# Patient Record
Sex: Female | Born: 1960 | Race: White | Hispanic: No | Marital: Married | Smoking: Current every day smoker
Health system: Southern US, Community
[De-identification: ages and names within clinical notes are randomized; demographics above are authoritative.]

## PROBLEM LIST (undated history)

## (undated) DIAGNOSIS — F419 Anxiety disorder, unspecified: Secondary | ICD-10-CM

## (undated) DIAGNOSIS — K219 Gastro-esophageal reflux disease without esophagitis: Secondary | ICD-10-CM

## (undated) DIAGNOSIS — E119 Type 2 diabetes mellitus without complications: Secondary | ICD-10-CM

## (undated) DIAGNOSIS — F319 Bipolar disorder, unspecified: Secondary | ICD-10-CM

## (undated) DIAGNOSIS — T8859XA Other complications of anesthesia, initial encounter: Secondary | ICD-10-CM

## (undated) DIAGNOSIS — F429 Obsessive-compulsive disorder, unspecified: Secondary | ICD-10-CM

## (undated) DIAGNOSIS — E7800 Pure hypercholesterolemia, unspecified: Secondary | ICD-10-CM

## (undated) DIAGNOSIS — I639 Cerebral infarction, unspecified: Secondary | ICD-10-CM

## (undated) DIAGNOSIS — F410 Panic disorder [episodic paroxysmal anxiety] without agoraphobia: Secondary | ICD-10-CM

## (undated) DIAGNOSIS — T4145XA Adverse effect of unspecified anesthetic, initial encounter: Secondary | ICD-10-CM

## (undated) DIAGNOSIS — F32A Depression, unspecified: Secondary | ICD-10-CM

## (undated) DIAGNOSIS — C439 Malignant melanoma of skin, unspecified: Secondary | ICD-10-CM

## (undated) DIAGNOSIS — J449 Chronic obstructive pulmonary disease, unspecified: Secondary | ICD-10-CM

## (undated) DIAGNOSIS — F329 Major depressive disorder, single episode, unspecified: Secondary | ICD-10-CM

## (undated) DIAGNOSIS — K269 Duodenal ulcer, unspecified as acute or chronic, without hemorrhage or perforation: Secondary | ICD-10-CM

## (undated) DIAGNOSIS — Z8489 Family history of other specified conditions: Secondary | ICD-10-CM

## (undated) DIAGNOSIS — I739 Peripheral vascular disease, unspecified: Secondary | ICD-10-CM

## (undated) DIAGNOSIS — I6529 Occlusion and stenosis of unspecified carotid artery: Secondary | ICD-10-CM

## (undated) HISTORY — DX: Bipolar disorder, unspecified: F31.9

## (undated) HISTORY — PX: TONSILLECTOMY: SUR1361

## (undated) HISTORY — DX: Peripheral vascular disease, unspecified: I73.9

## (undated) HISTORY — DX: Depression, unspecified: F32.A

## (undated) HISTORY — DX: Cerebral infarction, unspecified: I63.9

## (undated) HISTORY — DX: Anxiety disorder, unspecified: F41.9

## (undated) HISTORY — DX: Panic disorder (episodic paroxysmal anxiety): F41.0

## (undated) HISTORY — DX: Type 2 diabetes mellitus without complications: E11.9

## (undated) HISTORY — DX: Occlusion and stenosis of unspecified carotid artery: I65.29

## (undated) HISTORY — DX: Obsessive-compulsive disorder, unspecified: F42.9

## (undated) HISTORY — DX: Major depressive disorder, single episode, unspecified: F32.9

## (undated) HISTORY — DX: Malignant melanoma of skin, unspecified: C43.9

## (undated) HISTORY — DX: Gastro-esophageal reflux disease without esophagitis: K21.9

## (undated) HISTORY — DX: Duodenal ulcer, unspecified as acute or chronic, without hemorrhage or perforation: K26.9

## (undated) HISTORY — DX: Chronic obstructive pulmonary disease, unspecified: J44.9

---

## 1982-08-20 HISTORY — PX: TUBAL LIGATION: SHX77

## 1996-08-20 DIAGNOSIS — C439 Malignant melanoma of skin, unspecified: Secondary | ICD-10-CM

## 1996-08-20 HISTORY — DX: Malignant melanoma of skin, unspecified: C43.9

## 1996-08-20 HISTORY — PX: LYMPH NODE DISSECTION: SHX5087

## 1996-08-20 HISTORY — PX: MELANOMA EXCISION: SHX5266

## 1999-08-21 HISTORY — PX: CHOLECYSTECTOMY: SHX55

## 2010-08-20 HISTORY — PX: CATARACT EXTRACTION, BILATERAL: SHX1313

## 2011-10-29 ENCOUNTER — Encounter: Payer: Self-pay | Admitting: Family Medicine

## 2011-10-29 ENCOUNTER — Ambulatory Visit (INDEPENDENT_AMBULATORY_CARE_PROVIDER_SITE_OTHER): Payer: Medicare Other | Admitting: Family Medicine

## 2011-10-29 VITALS — BP 128/72 | HR 67 | Resp 18 | Ht 65.5 in | Wt 178.0 lb

## 2011-10-29 DIAGNOSIS — Z720 Tobacco use: Secondary | ICD-10-CM

## 2011-10-29 DIAGNOSIS — F17200 Nicotine dependence, unspecified, uncomplicated: Secondary | ICD-10-CM

## 2011-10-29 DIAGNOSIS — F410 Panic disorder [episodic paroxysmal anxiety] without agoraphobia: Secondary | ICD-10-CM

## 2011-10-29 DIAGNOSIS — J449 Chronic obstructive pulmonary disease, unspecified: Secondary | ICD-10-CM

## 2011-10-29 DIAGNOSIS — F319 Bipolar disorder, unspecified: Secondary | ICD-10-CM

## 2011-10-29 DIAGNOSIS — Z85820 Personal history of malignant melanoma of skin: Secondary | ICD-10-CM

## 2011-10-29 NOTE — Patient Instructions (Signed)
It was nice to meet you today! Schedule a physical within the next 6 weeks Do not eat after midnight as blood work will need to be done Please schedule Mammogram Continue current medications

## 2011-10-29 NOTE — Progress Notes (Signed)
  Subjective:    Patient ID: Ashley Powell, female    DOB: March 04, 1961, 51 y.o.   MRN: [id number]  HPI Pt here to establish care, Previous PCP Dr. Marchia Bond Psychiatrist- Dr. Carroll Sage, couselor Zada Girt  Bipolar- followed by psychiatry. She had been mental breakdown a few years ago requiring hospitalization. She was previously working for the police department. She has a very good support system with her husband and daughter. She was most recently started on intake. She is now on disability. She suffers with anxiety which is mostly controlled with Ativan. She has many social anxiety and is fearful of new things.  Panic attacks- patient deals with panic attacks daily. She has palpitations headache chest pains when they occur.  COPD-uses symbicort as needed. Continues to smoke one pack per day.   Review of Systems  GEN- denies fatigue, fever, weight loss,weakness, recent illness HEENT- denies eye drainage, change in vision, nasal discharge, CVS- denies chest pain,+ palpitations RESP- denies SOB, cough, wheeze ABD- denies N/V, change in stools, abd pain GU- denies dysuria, hematuria, dribbling, incontinence MSK- denies joint pain, muscle aches, injury Neuro- + headache, dizziness, syncope, seizure activity       Objective:   Physical Exam GEN- NAD, alert and oriented x3 HEENT- PERRL, EOMI, non injected sclera, pink conjunctiva, MMM, oropharynx clear Neck- Supple, no thyromegaly, no bruit CVS- RRR, no murmur RESP-CTAB ABD-NABS,soft, NT,ND EXT- No edema Pulses- Radial, DP- 2+ Psych- +anxious appearing,not depressed appearing, no apparent hallucinations       Assessment & Plan:

## 2011-10-31 ENCOUNTER — Ambulatory Visit: Payer: Self-pay | Admitting: Family Medicine

## 2011-11-01 ENCOUNTER — Encounter: Payer: Self-pay | Admitting: Family Medicine

## 2011-11-01 DIAGNOSIS — Z85820 Personal history of malignant melanoma of skin: Secondary | ICD-10-CM | POA: Insufficient documentation

## 2011-11-01 DIAGNOSIS — F410 Panic disorder [episodic paroxysmal anxiety] without agoraphobia: Secondary | ICD-10-CM | POA: Insufficient documentation

## 2011-11-01 DIAGNOSIS — F319 Bipolar disorder, unspecified: Secondary | ICD-10-CM | POA: Insufficient documentation

## 2011-11-01 DIAGNOSIS — J449 Chronic obstructive pulmonary disease, unspecified: Secondary | ICD-10-CM | POA: Insufficient documentation

## 2011-11-01 DIAGNOSIS — Z720 Tobacco use: Secondary | ICD-10-CM | POA: Insufficient documentation

## 2011-11-01 NOTE — Assessment & Plan Note (Signed)
She will need dermatology follow-up yearly for skin checks

## 2011-11-01 NOTE — Assessment & Plan Note (Signed)
Followed by psychiatry 

## 2011-11-01 NOTE — Assessment & Plan Note (Signed)
I will obtain records regarding this. She will need PFT done at some point, currently no everyday medications   Send for PCP Records next visit

## 2011-11-01 NOTE — Assessment & Plan Note (Signed)
Followed by pyschiatry 

## 2011-12-10 ENCOUNTER — Other Ambulatory Visit: Payer: Self-pay | Admitting: Family Medicine

## 2011-12-10 ENCOUNTER — Ambulatory Visit (INDEPENDENT_AMBULATORY_CARE_PROVIDER_SITE_OTHER): Payer: BC Managed Care – PPO | Admitting: Family Medicine

## 2011-12-10 ENCOUNTER — Encounter: Payer: Self-pay | Admitting: Family Medicine

## 2011-12-10 ENCOUNTER — Other Ambulatory Visit (HOSPITAL_COMMUNITY)
Admission: RE | Admit: 2011-12-10 | Discharge: 2011-12-10 | Disposition: A | Discharge status: Home / Self Care | Payer: BC Managed Care – PPO | Source: Ambulatory Visit | Attending: Family Medicine | Admitting: Family Medicine

## 2011-12-10 VITALS — BP 124/68 | HR 80 | Resp 18 | Ht 65.5 in | Wt 172.1 lb

## 2011-12-10 DIAGNOSIS — Z13228 Encounter for screening for other metabolic disorders: Secondary | ICD-10-CM

## 2011-12-10 DIAGNOSIS — Z79899 Other long term (current) drug therapy: Secondary | ICD-10-CM

## 2011-12-10 DIAGNOSIS — F17200 Nicotine dependence, unspecified, uncomplicated: Secondary | ICD-10-CM

## 2011-12-10 DIAGNOSIS — Z01419 Encounter for gynecological examination (general) (routine) without abnormal findings: Secondary | ICD-10-CM | POA: Insufficient documentation

## 2011-12-10 DIAGNOSIS — Z85828 Personal history of other malignant neoplasm of skin: Secondary | ICD-10-CM

## 2011-12-10 DIAGNOSIS — Z139 Encounter for screening, unspecified: Secondary | ICD-10-CM

## 2011-12-10 DIAGNOSIS — R197 Diarrhea, unspecified: Secondary | ICD-10-CM

## 2011-12-10 DIAGNOSIS — Z130 Encounter for screening for diseases of the blood and blood-forming organs and certain disorders involving the immune mechanism: Secondary | ICD-10-CM

## 2011-12-10 DIAGNOSIS — Z124 Encounter for screening for malignant neoplasm of cervix: Secondary | ICD-10-CM

## 2011-12-10 DIAGNOSIS — Z720 Tobacco use: Secondary | ICD-10-CM

## 2011-12-10 DIAGNOSIS — Z1321 Encounter for screening for nutritional disorder: Secondary | ICD-10-CM

## 2011-12-10 DIAGNOSIS — Z13220 Encounter for screening for lipoid disorders: Secondary | ICD-10-CM

## 2011-12-10 LAB — CBC WITH DIFFERENTIAL/PLATELET
Basophils Absolute: 0.1 10*3/uL (ref 0.0–0.1)
Basophils Relative: 1 % (ref 0–1)
Eosinophils Absolute: 0.3 10*3/uL (ref 0.0–0.7)
Hemoglobin: 14.5 g/dL (ref 12.0–15.0)
MCH: 30.1 pg (ref 26.0–34.0)
MCHC: 33.1 g/dL (ref 30.0–36.0)
Monocytes Absolute: 0.9 10*3/uL (ref 0.1–1.0)
Monocytes Relative: 13 % — ABNORMAL HIGH (ref 3–12)
Neutro Abs: 2.9 10*3/uL (ref 1.7–7.7)
Neutrophils Relative %: 41 % — ABNORMAL LOW (ref 43–77)
RDW: 14.4 % (ref 11.5–15.5)

## 2011-12-10 LAB — LIPID PANEL
Cholesterol: 326 mg/dL — ABNORMAL HIGH (ref 0–200)
LDL Cholesterol: 203 mg/dL — ABNORMAL HIGH (ref 0–99)
Triglycerides: 373 mg/dL — ABNORMAL HIGH (ref <150)
VLDL: 75 mg/dL — ABNORMAL HIGH (ref 0–40)

## 2011-12-10 LAB — TSH: TSH: 2.783 u[IU]/mL (ref 0.350–4.500)

## 2011-12-10 LAB — COMPREHENSIVE METABOLIC PANEL
ALT: 15 U/L (ref 0–35)
AST: 24 U/L (ref 0–37)
Albumin: 4.4 g/dL (ref 3.5–5.2)
Alkaline Phosphatase: 52 U/L (ref 39–117)
Calcium: 10.2 mg/dL (ref 8.4–10.5)
Chloride: 100 mEq/L (ref 96–112)
Potassium: 5.5 mEq/L — ABNORMAL HIGH (ref 3.5–5.3)
Sodium: 138 mEq/L (ref 135–145)

## 2011-12-10 LAB — POC HEMOCCULT BLD/STL (OFFICE/1-CARD/DIAGNOSTIC): Fecal Occult Blood, POC: NEGATIVE

## 2011-12-10 NOTE — Assessment & Plan Note (Signed)
PAP Smear done Pt to check with insurance about shingles vaccine Labs to be done Mammogram to be scheduled

## 2011-12-10 NOTE — Patient Instructions (Signed)
We will send a letter with your lab results, labs will also be sent to Dr. Tiburcio Pea Continue current meds I recommend calcium(1200mg ) and Vit D (800IU) supplement daily  Mammogram to be scheduled I will make a referral for GI and Dermatology I recommend Eye visit once a year I recommend Dental visit every 6 months Continue to work on the smoking F/U 6 months

## 2011-12-10 NOTE — Assessment & Plan Note (Signed)
Refer for evaluation and colonoscopy

## 2011-12-10 NOTE — Progress Notes (Signed)
  Subjective:    Patient ID: Ashley Powell, female    DOB: [birth year], 51 y.o.   MRN: [id number]  HPI Patient here for GYN exam. Medications and history reviewed Last menstrual cycle 2008 Diarrhea- chronic history of diarrhea after eating. She has multiple evacuations of runny watery stool a day. She has not seen gastroenterology for this. Denies any blood in the stool.She feels this is worsening  Due for mammogram Due for labs  Review of Systems  GEN- denies fatigue, fever, weight loss,weakness, recent illness HEENT- denies eye drainage, change in vision, nasal discharge, CVS- denies chest pain, palpitations RESP- denies SOB, cough, wheeze ABD- denies N/V, +change in stools, abd pain GU- denies dysuria, hematuria, dribbling, incontinence MSK- denies joint pain, muscle aches, injury Neuro- denies headache, dizziness, syncope, seizure activity      Objective:   Physical Exam GEN- NAD, alert and oriented x 3 Breast- normal symmetry, no nipple inversion,no nipple drainage, no nodules or lumps felt Nodes- no axillary nodes ABD-NABS,soft, NT,ND GU- normal external genitalia, vaginal mucosa pink and moist, cervix visualized no growth, no blood form os,no discharge, no CMT, no ovarian masses, uterus normal size        Assessment & Plan:

## 2011-12-10 NOTE — Assessment & Plan Note (Signed)
Advised cessation 

## 2011-12-10 NOTE — Assessment & Plan Note (Signed)
Refer to establish for skin surveillance

## 2011-12-11 ENCOUNTER — Encounter: Payer: Self-pay | Admitting: Urgent Care

## 2011-12-11 ENCOUNTER — Ambulatory Visit (HOSPITAL_COMMUNITY)
Admission: RE | Admit: 2011-12-11 | Discharge: 2011-12-11 | Disposition: A | Discharge status: Home / Self Care | Payer: BC Managed Care – PPO | Source: Ambulatory Visit | Attending: Family Medicine | Admitting: Family Medicine

## 2011-12-11 ENCOUNTER — Ambulatory Visit (INDEPENDENT_AMBULATORY_CARE_PROVIDER_SITE_OTHER): Payer: BC Managed Care – PPO | Admitting: Urgent Care

## 2011-12-11 VITALS — BP 119/71 | HR 85 | Temp 97.7°F | Ht 65.0 in | Wt 173.0 lb

## 2011-12-11 DIAGNOSIS — R110 Nausea: Secondary | ICD-10-CM | POA: Insufficient documentation

## 2011-12-11 DIAGNOSIS — Z1231 Encounter for screening mammogram for malignant neoplasm of breast: Secondary | ICD-10-CM | POA: Insufficient documentation

## 2011-12-11 DIAGNOSIS — R197 Diarrhea, unspecified: Secondary | ICD-10-CM

## 2011-12-11 DIAGNOSIS — K219 Gastro-esophageal reflux disease without esophagitis: Secondary | ICD-10-CM

## 2011-12-11 DIAGNOSIS — Z139 Encounter for screening, unspecified: Secondary | ICD-10-CM

## 2011-12-11 MED ORDER — SOD PICOSULFATE-MAG OX-CIT ACD 10-3.5-12 MG-GM-GM PO PACK: 1.0000 | PACK | Freq: Once | ORAL | Status: DC

## 2011-12-11 NOTE — Progress Notes (Signed)
Referring Provider: Salley Scarlet, MD Primary Care Physician:  Milinda Antis, MD, MD Primary Gastroenterologist:  Dr. Darrick Penna  Chief Complaint  Patient presents with  . Diarrhea   HPI:  Ashley Powell is a 51 y.o. female here as a referral from Dr. Jeanice Lim for diarrhea.  Also c/o nausea w/ eating a few bites of food.  Denies vomiting.  Notes wt loss 4 sizes & 30# in past year.  As soon as she eats, runs to bathroom w/ significant urgency to defecate.  Has been having episodes of explosive watery diarrhea followed by multiple stools.  She has no warning her to the onset of her symptoms.  Her symptoms occur frequently when she goes out to eat. Rare solid stools.  Denies any history of constipation. Denies rectal bleeding, mucus in stool, or melena.  Notice is that her stools are greenish-yellow.  Diarrhea has been persistent for years however it has been worse status post cholecystectomy years ago.  She has daily symptoms.  She also has been having daily heartburn and indigestion. She is not on PPI, nor taking any over-the-counter meds for this. Denies dysphagia or odynophagia.  Past Medical History  Diagnosis Date  . Depression   . COPD (chronic obstructive pulmonary disease) 2010 ?  Marland Kitchen GERD (gastroesophageal reflux disease)   . Malignant melanoma 1998    Stage III-left shoulder  . Bipolar 1 disorder   . Anxiety   . Panic attacks   . DU (duodenal ulcer)     age 54    Past Surgical History  Procedure Date  . Tubal ligation 1984  . Cholecystectomy 2001  . Cataract extraction, bilateral 2012  . Melanoma excision 1998  . Lymph node dissection 1998    Current Outpatient Prescriptions  Medication Sig Dispense Refill  . divalproex (DEPAKOTE ER) 500 MG 24 hr tablet Take by mouth daily. 500mg  in AM, 1000mg  in pm      . hydrOXYzine (ATARAX/VISTARIL) 50 MG tablet Take 50 mg by mouth at bedtime.      Marland Kitchen LORazepam (ATIVAN) 1 MG tablet Take 1 mg by mouth every 6 (six) hours as needed.      .  methylphenidate (RITALIN) 5 MG tablet Take 5 mg by mouth 2 (two) times daily. Specific dose unknown      . sertraline (ZOLOFT) 100 MG tablet Take 200 mg by mouth daily.      . traZODone (DESYREL) 100 MG tablet Take 50 mg by mouth at bedtime.         Allergies as of 12/11/2011  . (No Known Allergies)    Family History:There is no known family history of colorectal carcinoma , liver disease, or inflammatory bowel disease.  Problem Relation Age of Onset  . Depression Mother   . Hypertension Father   . Hyperlipidemia Father   . Heart disease Father   . Diabetes Father   . Hypertension Brother   . Heart disease Brother   . Hyperlipidemia Brother   . Depression Brother     History   Social History  . Marital Status: Married    Spouse Name: N/A    Number of Children: 1  . Years of Education: N/A   Occupational History  . disabled-police dept    Social History Main Topics  . Smoking status: Current Everyday Smoker -- 1.0 packs/day for 22 years    Types: Cigarettes  . Smokeless tobacco: Not on file  . Alcohol Use: No  . Drug Use: No  .  Sexually Active: Not on file  Review of Systems: Gen: Denies any fever, chills, sweats, anorexia, fatigue, weakness, malaise, weight loss, and sleep disorder CV: Denies chest pain, angina, palpitations, syncope, orthopnea, PND, peripheral edema, and claudication. Resp: Denies dyspnea at rest, dyspnea with exercise, cough, sputum, wheezing, coughing up blood, and pleurisy. GI: Denies vomiting blood, jaundice, and fecal incontinence.  GU : Denies urinary burning, blood in urine, urinary frequency, urinary hesitancy, nocturnal urination, and urinary incontinence. MS: Denies joint pain, limitation of movement, and swelling, stiffness, low back pain, extremity pain. Denies muscle weakness, cramps, atrophy.  Derm: Denies rash, itching, dry skin, hives, moles, warts, or unhealing ulcers.  Psych: Denies depression, anxiety, memory loss, suicidal  ideation, hallucinations, paranoia, and confusion. Heme: Denies bruising, bleeding, and enlarged lymph nodes. Neuro:  Denies any headaches, dizziness, paresthesias. Endo:  Denies any problems with DM, thyroid, adrenal function.  Physical Exam: BP 119/71  Pulse 85  Temp(Src) 97.7 F (36.5 C) (Temporal)  Ht 5\' 5"  (1.651 m)  Wt 173 lb (78.472 kg)  BMI 28.79 kg/m2 General:   Alert,  Well-developed, well-nourished, pleasant and cooperative in NAD Head:  Normocephalic and atraumatic. Eyes:  Sclera clear, no icterus.   Conjunctiva pink. Ears:  Normal auditory acuity. Nose:  No deformity, discharge, or lesions. Mouth:  No deformity or lesions,oropharynx pink & moist. Neck:  Supple; no masses or thyromegaly. Lungs:  Clear throughout to auscultation.   No wheezes, crackles, or rhonchi. No acute distress. Heart:  Regular rate and rhythm; no murmurs, clicks, rubs,  or gallops. Abdomen:  Normal bowel sounds.  No bruits.  Soft, non-tender and non-distended without masses, hepatosplenomegaly or hernias noted.  No guarding or rebound tenderness.   Rectal:  Deferred until colonoscopy. Msk:  Symmetrical without gross deformities. Normal posture. Pulses:  Normal pulses noted. Extremities:  No clubbing or edema. Neurologic:  Alert and oriented x4;  grossly normal neurologically. Skin:  Intact without significant lesions or rashes. Lymph Nodes:  No significant cervical adenopathy. Psych:  Alert and cooperative. Normal mood and affect.

## 2011-12-11 NOTE — Patient Instructions (Signed)
Begin ALIGN daily Return stool studies and get labs as soon as possible You will need a colonoscopy and EGD (upper endoscopy) with Dr. Darrick Penna 1-800-quit-now for help quitting smoking

## 2011-12-12 ENCOUNTER — Telehealth: Payer: Self-pay | Admitting: Family Medicine

## 2011-12-12 DIAGNOSIS — E875 Hyperkalemia: Secondary | ICD-10-CM

## 2011-12-12 DIAGNOSIS — K219 Gastro-esophageal reflux disease without esophagitis: Secondary | ICD-10-CM | POA: Insufficient documentation

## 2011-12-12 MED ORDER — ROSUVASTATIN CALCIUM 10 MG PO TABS: 10.0000 mg | ORAL_TABLET | Freq: Every day | ORAL | Status: DC

## 2011-12-12 NOTE — Assessment & Plan Note (Addendum)
Ashley Powell is a pleasant 51 y.o. female  w/ [place] any history of chronic nonbloody diarrhea, worse than status post cholecystectomy 2001, and more frequently worse over the last several weeks associated with nausea and significant urgency.  She will need colonoscopy by Dr. Darrick Penna for further evaluation.  I have discussed risks & benefits which include, but are not limited to, bleeding, infection, perforation & drug reaction.  The patient agrees with this plan & written consent will be obtained.  Differentials include irritable bowel syndrome, microscopic colitis, or celiac disease. Doubt acute infectious process on chronic etiology, however will check Giardia, culture, lactoferrin  and C. Difficile.  TTG IgA Begin ALIGN daily 1-800-quit-now for help quitting smoking

## 2011-12-12 NOTE — Progress Notes (Signed)
Faxed to PCP

## 2011-12-12 NOTE — Assessment & Plan Note (Signed)
Daily GERD symptoms with nausea.  Hx duodenal ulcer as child.  History of malignant melanoma in remission. Offered PPI & antiemetics.  Pt declined.  EGD for further evaluation to look for peptic ulcer disease, gastritis, complicated GERD, less likely malignancy.  I have discussed risks & benefits which include, but are not limited to, bleeding, infection, perforation & drug reaction.  The patient agrees with this plan & written consent will be obtained.

## 2011-12-12 NOTE — Telephone Encounter (Signed)
Please give pt message, her cholesterol is extremely high, she needs to start a cholesterol lowering medication, Please have her start the crestor at bedtime. She needs to avoid fried food, fatty foods, eat more fruits and veggies, look on containers for low fat , low cholesterol foods If she is taking a potassium supplement she needs to stop as her potassium levels are a little high, Her kidney and liver function are normal, hemoglobin is normal Thyroid is normal Vitamin D is pending. If this is low we will send in replacement  for her.  Please fax a copy of labs to her psychiatrist Carroll Sage and send pt a copy of labs

## 2011-12-12 NOTE — Assessment & Plan Note (Signed)
See GERD 

## 2011-12-14 LAB — GIARDIA/CRYPTOSPORIDIUM (EIA): Cryptosporidium Screen (EIA): NEGATIVE

## 2011-12-14 LAB — TISSUE TRANSGLUTAMINASE, IGA: Tissue Transglutaminase Ab, IgA: 6.2 U/mL (ref <20)

## 2011-12-17 LAB — STOOL CULTURE

## 2011-12-17 NOTE — Progress Notes (Signed)
Quick Note:  Please let pt know her stool studies were negative for infection or inflammation. Her blood work was negative for celiac disease. Proceed w/ colonoscopy & EGD as planned. Thanks RU:[id number], Kingsley Spittle, MD ______

## 2011-12-17 NOTE — Progress Notes (Signed)
Quick Note:  Called and informed pt. ______ 

## 2011-12-21 LAB — BASIC METABOLIC PANEL
CO2: 28 mEq/L (ref 19–32)
Glucose, Bld: 82 mg/dL (ref 70–99)
Potassium: 5 mEq/L (ref 3.5–5.3)
Sodium: 137 mEq/L (ref 135–145)

## 2011-12-24 ENCOUNTER — Encounter (HOSPITAL_COMMUNITY): Payer: Self-pay | Admitting: Pharmacy Technician

## 2011-12-25 ENCOUNTER — Encounter (HOSPITAL_COMMUNITY): Payer: Self-pay

## 2011-12-25 ENCOUNTER — Encounter (HOSPITAL_COMMUNITY)
Admission: RE | Admit: 2011-12-25 | Discharge: 2011-12-25 | Disposition: A | Discharge status: Home / Self Care | Payer: BC Managed Care – PPO | Source: Ambulatory Visit | Attending: Gastroenterology | Admitting: Gastroenterology

## 2011-12-25 HISTORY — DX: Pure hypercholesterolemia, unspecified: E78.00

## 2011-12-25 NOTE — Patient Instructions (Signed)
21 Ashley Powell  12/25/2011   Your procedure is scheduled on:  Tuesday, 01/01/12  Report to [facility] at 0730 AM.  Call this number if you have problems the morning of surgery: [phone]   Remember:   Do not eat food:After Midnight.  May have clear liquids:until Midnight .  Clear liquids include soda, tea, black coffee, apple or grape juice, broth.  Take these medicines the morning of surgery with A SIP OF WATER: depakote, ritalin, zoloft, ativan if needed   Do not wear jewelry, make-up or nail polish.  Do not wear lotions, powders, or perfumes. You may wear deodorant.  Do not shave 48 hours prior to surgery.  Do not bring valuables to the hospital.  Contacts, dentures or bridgework may not be worn into surgery.  Leave suitcase in the car. After surgery it may be brought to your room.  For patients admitted to the hospital, checkout time is 11:00 AM the day of discharge.   Patients discharged the day of surgery will not be allowed to drive home.  Name and phone number of your driver: driver  Special Instructions: Follow special instructions given to you at Dr. Odis Luster office about your prep for procedure.   Please read over the following fact sheets that you were given: Anesthesia Post-op Instructions and Care and Recovery After Surgery   Esophagogastroduodenoscopy This is an endoscopic procedure (a procedure that uses a device like a flexible telescope) that allows your caregiver to view the upper stomach and small bowel. This test allows your caregiver to look at the esophagus. The esophagus carries food from your mouth to your stomach. They can also look at your duodenum. This is the first part of the small intestine that attaches to the stomach. This test is used to detect problems in the bowel such as ulcers and inflammation. PREPARATION FOR TEST Nothing to eat after midnight the day before the test. NORMAL FINDINGS Normal esophagus, stomach, and duodenum. Ranges for normal  findings may vary among different laboratories and hospitals. You should always check with your doctor after having lab work or other tests done to discuss the meaning of your test results and whether your values are considered within normal limits. MEANING OF TEST  Your caregiver will go over the test results with you and discuss the importance and meaning of your results, as well as treatment options and the need for additional tests if necessary. OBTAINING THE TEST RESULTS It is your responsibility to obtain your test results. Ask the lab or department performing the test when and how you will get your results. Document Released: 12/07/2004 Document Revised: 07/26/2011 Document Reviewed: 07/16/2008 [facility] Patient Information 2012 [place], [place].Colonoscopy Care After These instructions give you information on caring for yourself after your procedure. Your doctor may also give you more specific instructions. Call your doctor if you have any problems or questions after your procedure. HOME CARE  Take it easy for the next 24 hours.   Rest.   Walk or use warm packs on your belly (abdomen) if you have belly cramping or gas.   Do not drive for 24 hours.   You may shower.   Do not sign important papers or use machinery for 24 hours.   Drink enough fluids to keep your pee (urine) clear or pale yellow.   Resume your normal diet. Avoid heavy or fried foods.   Avoid alcohol.   Continue taking your normal medicines.   Only take medicine as told by your doctor.  Do not take aspirin.  If you had growths (polyps) removed:  Do not take aspirin.   Do not drink alcohol for 7 days or as told by your doctor.   Eat a soft diet for 24 hours.  GET HELP RIGHT AWAY IF:  You have a fever.   You pass clumps of tissue (blood clots) or fill the toilet with blood.   You have belly pain that gets worse and medicine does not help.   Your belly is puffy (swollen).   You feel sick to your stomach  (nauseous) or throw up (vomit).  MAKE SURE YOU:  Understand these instructions.   Will watch your condition.   Will get help right away if you are not doing well or get worse.  Document Released: 09/08/2010 Document Revised: 07/26/2011 Document Reviewed: 09/08/2010 [facility] Patient Information 2012 [place], [place].

## 2011-12-27 NOTE — Progress Notes (Signed)
REVIEWED.  

## 2011-12-27 NOTE — Progress Notes (Signed)
TCS MAY 14

## 2011-12-31 ENCOUNTER — Telehealth: Payer: Self-pay | Admitting: Gastroenterology

## 2011-12-31 NOTE — Telephone Encounter (Signed)
Pt called- she has lost her instructions & wanted them emailed to her- instructions emailed to [email or web address]

## 2012-01-01 ENCOUNTER — Ambulatory Visit (HOSPITAL_COMMUNITY): Payer: BC Managed Care – PPO | Admitting: Anesthesiology

## 2012-01-01 ENCOUNTER — Encounter (HOSPITAL_COMMUNITY): Payer: Self-pay | Admitting: Anesthesiology

## 2012-01-01 ENCOUNTER — Encounter (HOSPITAL_COMMUNITY): Payer: Self-pay | Admitting: *Deleted

## 2012-01-01 ENCOUNTER — Ambulatory Visit (HOSPITAL_COMMUNITY)
Admission: RE | Admit: 2012-01-01 | Discharge: 2012-01-01 | Disposition: A | Discharge status: Home / Self Care | Payer: BC Managed Care – PPO | Source: Ambulatory Visit | Attending: Gastroenterology | Admitting: Gastroenterology

## 2012-01-01 ENCOUNTER — Encounter (HOSPITAL_COMMUNITY)
Admission: RE | Disposition: A | Discharge status: Home / Self Care | Payer: Self-pay | Source: Ambulatory Visit | Attending: Gastroenterology

## 2012-01-01 DIAGNOSIS — K2990 Gastroduodenitis, unspecified, without bleeding: Secondary | ICD-10-CM

## 2012-01-01 DIAGNOSIS — K620 Anal polyp: Secondary | ICD-10-CM

## 2012-01-01 DIAGNOSIS — R109 Unspecified abdominal pain: Secondary | ICD-10-CM

## 2012-01-01 DIAGNOSIS — K621 Rectal polyp: Secondary | ICD-10-CM

## 2012-01-01 DIAGNOSIS — E7800 Pure hypercholesterolemia, unspecified: Secondary | ICD-10-CM | POA: Insufficient documentation

## 2012-01-01 DIAGNOSIS — K449 Diaphragmatic hernia without obstruction or gangrene: Secondary | ICD-10-CM | POA: Insufficient documentation

## 2012-01-01 DIAGNOSIS — K2970 Gastritis, unspecified, without bleeding: Secondary | ICD-10-CM

## 2012-01-01 DIAGNOSIS — J449 Chronic obstructive pulmonary disease, unspecified: Secondary | ICD-10-CM | POA: Insufficient documentation

## 2012-01-01 DIAGNOSIS — J4489 Other specified chronic obstructive pulmonary disease: Secondary | ICD-10-CM | POA: Insufficient documentation

## 2012-01-01 DIAGNOSIS — D128 Benign neoplasm of rectum: Secondary | ICD-10-CM | POA: Insufficient documentation

## 2012-01-01 DIAGNOSIS — R197 Diarrhea, unspecified: Secondary | ICD-10-CM | POA: Insufficient documentation

## 2012-01-01 DIAGNOSIS — D126 Benign neoplasm of colon, unspecified: Secondary | ICD-10-CM

## 2012-01-01 DIAGNOSIS — K648 Other hemorrhoids: Secondary | ICD-10-CM | POA: Insufficient documentation

## 2012-01-01 DIAGNOSIS — K222 Esophageal obstruction: Secondary | ICD-10-CM | POA: Insufficient documentation

## 2012-01-01 HISTORY — PX: COLONOSCOPY: SHX174

## 2012-01-01 HISTORY — PX: ESOPHAGOGASTRODUODENOSCOPY: SHX1529

## 2012-01-01 SURGERY — COLONOSCOPY WITH PROPOFOL
Anesthesia: Monitor Anesthesia Care

## 2012-01-01 MED ORDER — OMEPRAZOLE 20 MG PO CPDR: DELAYED_RELEASE_CAPSULE | ORAL | Status: DC

## 2012-01-01 MED ORDER — GLYCOPYRROLATE 0.2 MG/ML IJ SOLN
INTRAMUSCULAR | Status: AC
  Filled 2012-01-01: qty 1

## 2012-01-01 MED ORDER — STERILE WATER FOR IRRIGATION IR SOLN
Status: DC | PRN
  Administered 2012-01-01: 09:00:00

## 2012-01-01 MED ORDER — LIDOCAINE HCL 1 % IJ SOLN
INTRAMUSCULAR | Status: DC | PRN
  Administered 2012-01-01: 20 mg via INTRADERMAL

## 2012-01-01 MED ORDER — GLYCOPYRROLATE 0.2 MG/ML IJ SOLN
0.2000 mg | Freq: Once | INTRAMUSCULAR | Status: AC
  Administered 2012-01-01: 0.2 mg via INTRAVENOUS

## 2012-01-01 MED ORDER — WATER FOR IRRIGATION, STERILE IR SOLN
Status: DC | PRN
  Administered 2012-01-01: 1000 mL

## 2012-01-01 MED ORDER — MIDAZOLAM HCL 2 MG/2ML IJ SOLN
INTRAMUSCULAR | Status: AC
  Filled 2012-01-01: qty 2

## 2012-01-01 MED ORDER — LACTATED RINGERS IV SOLN
INTRAVENOUS | Status: DC
  Administered 2012-01-01: 1000 mL via INTRAVENOUS

## 2012-01-01 MED ORDER — DICYCLOMINE HCL 10 MG PO CAPS: 10.0000 mg | ORAL_CAPSULE | Freq: Four times a day (QID) | ORAL | Status: DC

## 2012-01-01 MED ORDER — FENTANYL CITRATE 0.05 MG/ML IJ SOLN
INTRAMUSCULAR | Status: AC
  Filled 2012-01-01: qty 2

## 2012-01-01 MED ORDER — BUTAMBEN-TETRACAINE-BENZOCAINE 2-2-14 % EX AERO
1.0000 | INHALATION_SPRAY | Freq: Once | CUTANEOUS | Status: AC
  Administered 2012-01-01: 1 via TOPICAL
  Filled 2012-01-01: qty 56

## 2012-01-01 MED ORDER — FENTANYL CITRATE 0.05 MG/ML IJ SOLN: 25.0000 ug | INTRAMUSCULAR | Status: DC | PRN

## 2012-01-01 MED ORDER — MIDAZOLAM HCL 2 MG/2ML IJ SOLN
1.0000 mg | INTRAMUSCULAR | Status: DC | PRN
  Administered 2012-01-01: 2 mg via INTRAVENOUS

## 2012-01-01 MED ORDER — ONDANSETRON HCL 4 MG/2ML IJ SOLN
INTRAMUSCULAR | Status: AC
  Filled 2012-01-01: qty 2

## 2012-01-01 MED ORDER — FENTANYL CITRATE 0.05 MG/ML IJ SOLN
INTRAMUSCULAR | Status: DC | PRN
  Administered 2012-01-01 (×2): 50 ug via INTRAVENOUS

## 2012-01-01 MED ORDER — MIDAZOLAM HCL 5 MG/5ML IJ SOLN
INTRAMUSCULAR | Status: DC | PRN
  Administered 2012-01-01: 2 mg via INTRAVENOUS

## 2012-01-01 MED ORDER — ONDANSETRON HCL 4 MG/2ML IJ SOLN
4.0000 mg | Freq: Once | INTRAMUSCULAR | Status: AC | PRN
  Administered 2012-01-01: 4 mg via INTRAVENOUS

## 2012-01-01 MED ORDER — PROPOFOL 10 MG/ML IV BOLUS
INTRAVENOUS | Status: DC | PRN
  Administered 2012-01-01 (×2): 20 mg via INTRAVENOUS

## 2012-01-01 MED ORDER — PROPOFOL 10 MG/ML IV EMUL
INTRAVENOUS | Status: DC | PRN
  Administered 2012-01-01: 50 ug/kg/min via INTRAVENOUS

## 2012-01-01 MED ORDER — PROPOFOL 10 MG/ML IV EMUL
INTRAVENOUS | Status: AC
  Filled 2012-01-01: qty 20

## 2012-01-01 SURGICAL SUPPLY — 23 items
BLOCK BITE 60FR ADLT L/F BLUE (MISCELLANEOUS) ×2 IMPLANT
ELECT REM PT RETURN 9FT ADLT (ELECTROSURGICAL)
ELECTRODE REM PT RTRN 9FT ADLT (ELECTROSURGICAL) IMPLANT
FCP BXJMBJMB 240X2.8X (CUTTING FORCEPS)
FLOOR PAD 36X40 (MISCELLANEOUS) ×2
FORCEP RJ3 GP 1.8X160 W-NEEDLE (CUTTING FORCEPS) IMPLANT
FORCEPS BIOP RAD 4 LRG CAP 4 (CUTTING FORCEPS) ×4 IMPLANT
FORCEPS BIOP RJ4 240 W/NDL (CUTTING FORCEPS)
FORCEPS BXJMBJMB 240X2.8X (CUTTING FORCEPS) IMPLANT
INJECTOR/SNARE I SNARE (MISCELLANEOUS) IMPLANT
LUBRICANT JELLY 4.5OZ STERILE (MISCELLANEOUS) ×2 IMPLANT
MANIFOLD NEPTUNE II (INSTRUMENTS) ×2 IMPLANT
NEEDLE SCLEROTHERAPY 25GX240 (NEEDLE) IMPLANT
PAD FLOOR 36X40 (MISCELLANEOUS) ×1 IMPLANT
PROBE APC STR FIRE (PROBE) IMPLANT
PROBE INJECTION GOLD (MISCELLANEOUS)
PROBE INJECTION GOLD 7FR (MISCELLANEOUS) IMPLANT
SNARE SHORT THROW 13M SML OVAL (MISCELLANEOUS) ×2 IMPLANT
SYR 50ML LL SCALE MARK (SYRINGE) ×2 IMPLANT
TRAP SPECIMEN MUCOUS 40CC (MISCELLANEOUS) IMPLANT
TUBING ENDO SMARTCAP PENTAX (MISCELLANEOUS) ×4 IMPLANT
TUBING IRRIGATION ENDOGATOR (MISCELLANEOUS) ×2 IMPLANT
WATER STERILE IRR 1000ML POUR (IV SOLUTION) ×4 IMPLANT

## 2012-01-01 NOTE — Transfer of Care (Signed)
Immediate Anesthesia Transfer of Care Note  Patient: Ashley Powell  Procedure(s) Performed: Procedure(s) (LRB): COLONOSCOPY WITH PROPOFOL (N/A) ESOPHAGOGASTRODUODENOSCOPY (EGD) WITH PROPOFOL (N/A)  Patient Location: PACU  Anesthesia Type: MAC  Level of Consciousness: sedated  Airway & Oxygen Therapy: Patient Spontanous Breathing  Post-op Assessment: Post -op Vital signs reviewed and stable  Post vital signs: Reviewed and stable  Complications: No apparent anesthesia complications

## 2012-01-01 NOTE — Addendum Note (Signed)
Addendum  created 01/01/12 1017 by Moshe Salisbury, CRNA   Modules edited:Anesthesia Flowsheet

## 2012-01-01 NOTE — Addendum Note (Signed)
Addendum  created 01/01/12 1018 by Moshe Salisbury, CRNA   Modules edited:Anesthesia Flowsheet

## 2012-01-01 NOTE — H&P (Signed)
Primary Care Physician:  Milinda Antis, MD, MD Primary Gastroenterologist:  Dr. Darrick Penna  Pre-Procedure History & Physical: HPI:  Ashley Powell is a 51 y.o. female here for DIARRHEA/GERD.  Past Medical History  Diagnosis Date  . Depression   . COPD (chronic obstructive pulmonary disease) 2010 ?  Marland Kitchen GERD (gastroesophageal reflux disease)   . Malignant melanoma 1998    Stage III-left shoulder  . Bipolar 1 disorder   . Anxiety   . Panic attacks   . DU (duodenal ulcer)     age 63  . Hypercholesteremia     Past Surgical History  Procedure Date  . Tubal ligation 1984  . Cholecystectomy 2001  . Cataract extraction, bilateral 2012  . Melanoma excision 1998    [facility]  . Lymph node dissection 1998    Prior to Admission medications   Medication Sig Start Date End Date Taking? Authorizing Provider  divalproex (DEPAKOTE) 500 MG DR tablet Take 500-1,000 mg by mouth 2 (two) times daily. Take 1 tablet (500 mg) in the morning and 2 tablets (1000 mg) at bedtime   Yes Historical Provider, MD  hydrOXYzine (ATARAX/VISTARIL) 50 MG tablet Take 50 mg by mouth at bedtime.   Yes Historical Provider, MD  LORazepam (ATIVAN) 1 MG tablet Take 1 mg by mouth 4 (four) times daily as needed. For anxiety   Yes Historical Provider, MD  methylphenidate (RITALIN) 20 MG tablet Take 10 mg by mouth 3 (three) times daily.   Yes Historical Provider, MD  Probiotic Product (ALIGN) 4 MG CAPS Take 1 capsule by mouth every morning.   Yes Historical Provider, MD  rosuvastatin (CRESTOR) 10 MG tablet Take 1 tablet (10 mg total) by mouth at bedtime. 12/12/11 12/11/12 Yes Salley Scarlet, MD  sertraline (ZOLOFT) 100 MG tablet Take 200 mg by mouth every morning.    Yes Historical Provider, MD  Sod Picosulfate-Mag Ox-Cit Acd (PREPOPIK) 10-3.5-12 MG-GM-GM PACK Take 1 kit by mouth once. 12/11/11  Yes [place] Bali, MD  traZODone (DESYREL) 100 MG tablet Take 100 mg by mouth at bedtime.    Yes Historical Provider, MD     Allergies as of 12/11/2011  . (Not on File)    Family History  Problem Relation Age of Onset  . Depression Mother   . Hypertension Father   . Hyperlipidemia Father   . Heart disease Father   . Diabetes Father   . Hypertension Brother   . Heart disease Brother   . Hyperlipidemia Brother   . Depression Brother     History   Social History  . Marital Status: Married    Spouse Name: N/A    Number of Children: 1  . Years of Education: N/A   Occupational History  . disabled-police dept    Social History Main Topics  . Smoking status: Current Everyday Smoker -- 1.0 packs/day for 22 years    Types: Cigarettes  . Smokeless tobacco: Not on file  . Alcohol Use: No  . Drug Use: No  . Sexually Active: Not on file   Other Topics Concern  . Not on file   Social History Narrative  . No narrative on file    Review of Systems: See HPI, otherwise negative ROS   Physical Exam: BP 130/79  Pulse 78  Temp(Src) 97.8 F (36.6 C) (Oral)  Resp 11  SpO2 98% General:   Alert,  pleasant and cooperative in NAD Head:  Normocephalic and atraumatic. Neck:  Supple;  Lungs:  Clear throughout  to auscultation.    Heart:  Regular rate and rhythm. Abdomen:  Soft, nontender and nondistended. Normal bowel sounds, without guarding, and without rebound.   Neurologic:  Alert and  oriented x4;  grossly normal neurologically.  Impression/Plan:    DIARRHEA/GERD  PLAN: 1. TCS/EGD TODAY.

## 2012-01-01 NOTE — Discharge Instructions (Signed)
You have REFLUX, WHICH HAS CAUSED MILD NARROWING AT THE BASE OF YOUR ESOPHAGUS. YOU NEED TREATMENT FOR YOUR REFLUX OR THE NARROWING WILL GET WORSE.  YOUR HAVE A SMALL HIATAL HERNIA AND GASTRITIS. YOU HAD 8 SMALL POLYPS REMOVED. You have internal hemorrhoids, WHICH MAY CAUSE RECTAL BLEEDING. I biopsied your stomach, SMALL BOWEL, & colon.  START OMEPRAZOLE TWICE DAILY FOR 3 MOS THEN DAILY.  ADD BENTYL 30 MINUTES PRIOR TO MEALS AND AT BEDTIME.  STOP ASPIRIN PRODUCTS FOR 2 WEEKS.  AVOID ITEMS THAT TRIGGER GASTRITIS. SEE INFO BELOW.  FOLLOW A HIGH FIBER/LOW FAT DIET. AVOID ITEMS THAT CAUSE BLOATING. SEE INFO BELOW.  LOSE 10 TO 20 LBS. IT WILL HELP YOUR REFLUX BE BETTER CONTROLLED AND DECREASE YOUR RISK FOR COLON CANCER.  YOUR BIOPSY RESULTS WILL BE BACK IN 7 DAYS.  FOLLOW UP IN 4 MOS.   ENDOSCOPY Care After Read the instructions outlined below and refer to this sheet in the next week. These discharge instructions provide you with general information on caring for yourself after you leave the hospital. While your treatment has been planned according to the most current medical practices available, unavoidable complications occasionally occur. If you have any problems or questions after discharge, call DR. Tonnette Zwiebel, [phone].  ACTIVITY  You may resume your regular activity, but move at a slower pace for the next 24 hours.   Take frequent rest periods for the next 24 hours.   Walking will help get rid of the air and reduce the bloated feeling in your belly (abdomen).   No driving for 24 hours (because of the medicine (anesthesia) used during the test).   You may shower.   Do not sign any important legal documents or operate any machinery for 24 hours (because of the anesthesia used during the test).    NUTRITION  Drink plenty of fluids.   You may resume your normal diet as instructed by your doctor.   Begin with a light meal and progress to your normal diet. Heavy or fried foods  are harder to digest and may make you feel sick to your stomach (nauseated).   Avoid alcoholic beverages for 24 hours or as instructed.    MEDICATIONS  You may resume your normal medications.   WHAT YOU CAN EXPECT TODAY  Some feelings of bloating in the abdomen.   Passage of more gas than usual.   Spotting of blood in your stool or on the toilet paper  .  IF YOU HAD POLYPS REMOVED DURING THE ENDOSCOPY:  Eat a soft diet IF YOU HAVE NAUSEA, BLOATING, ABDOMINAL PAIN, OR VOMITING.    FINDING OUT THE RESULTS OF YOUR TEST Not all test results are available during your visit. DR. Darrick Penna WILL CALL YOU WITHIN 7 DAYS OF YOUR PROCEDUE WITH YOUR RESULTS. Do not assume everything is normal if you have not heard from DR. Olivya Sobol IN ONE WEEK, CALL HER OFFICE AT [phone].  SEEK IMMEDIATE MEDICAL ATTENTION AND CALL THE OFFICE: [phone] IF:  You have more than a spotting of blood in your stool.   Your belly is swollen (abdominal distention).   You are nauseated or vomiting.   You have a temperature over 101F.   You have abdominal pain or discomfort that is severe or gets worse throughout the day.  Polyps, Colon  A polyp is extra tissue that grows inside your body. Colon polyps grow in the large intestine. The large intestine, also called the colon, is part of your digestive system. It is  a long, hollow tube at the end of your digestive tract where your body makes and stores stool. Most polyps are not dangerous. They are benign. This means they are not cancerous. But over time, some types of polyps can turn into cancer. Polyps that are smaller than a pea are usually not harmful. But larger polyps could someday become or may already be cancerous. To be safe, doctors remove all polyps and test them.   WHO GETS POLYPS? Anyone can get polyps, but certain people are more likely than others. You may have a greater chance of getting polyps if:  You are over 50.   You have had polyps  before.   Someone in your family has had polyps.   Someone in your family has had cancer of the large intestine.   Find out if someone in your family has had polyps. You may also be more likely to get polyps if you:   Eat a lot of fatty foods   Smoke   Drink alcohol   Do not exercise  Eat too much   TREATMENT  The caregiver will remove the polyp during sigmoidoscopy or colonoscopy.  PREVENTION There is not one sure way to prevent polyps. You might be able to lower your risk of getting them if you:  Eat more fruits and vegetables and less fatty food.   Do not smoke.   Avoid alcohol.   Exercise every day.   Lose weight if you are overweight.   Eating more calcium and folate can also lower your risk of getting polyps. Some foods that are rich in calcium are milk, cheese, and broccoli. Some foods that are rich in folate are chickpeas, kidney beans, and spinach.    Gastritis  Gastritis is an inflammation (the body's way of reacting to injury and/or infection) of the stomach. It is often caused by bacterial (germ) infections. It can also be caused BY ASPIRIN, BC/GOODY POWDER'S, (IBUPROFEN) MOTRIN, OR ALEVE (NAPROXEN), chemicals (including alcohol), SPICY FOODS, and medications. This illness may be associated with generalized malaise (feeling tired, not well), UPPER ABDOMINAL STOMACH cramps, and fever. One common bacterial cause of gastritis is an organism known as H. Pylori. This can be treated with antibiotics.     Hiatal Hernia A hiatal hernia occurs when a part of the stomach slides above the diaphragm. The diaphragm is the thin muscle separating the belly (abdomen) from the chest. A hiatal hernia can be something you are born with or develop over time. Hiatal hernias may allow stomach acid to flow back into your esophagus, the tube which carries food from your mouth to your stomach. If this acid causes problems it is called GERD (gastro-esophageal reflux disease).    SYMPTOMS Common symptoms of GERD are heartburn (burning in your chest). This is worse when lying down or bending over. It may also cause belching and indigestion. Some of the things which make GERD worse are:  Increased weight pushes on stomach making acid rise more easily.   Smoking markedly increases acid production.   Alcohol decreases lower esophageal sphincter pressure (valve between stomach and esophagus), allowing acid from stomach into esophagus.   Late evening meals and going to bed with a full stomach increases pressure.   Anything that causes an increase in acid production.    HOME CARE INSTRUCTIONS  Try to achieve and maintain an ideal body weight.   Avoid drinking alcoholic beverages.   DO NOT smokE.   Do not wear tight clothing around your  chest or stomach.   Eat smaller meals and eat more frequently. This keeps your stomach from getting too full. Eat slowly.   Do not lie down for 2 or 3 hours after eating. Do not eat or drink anything 1 to 2 hours before going to bed.   Avoid caffeine beverages (colas, coffee, cocoa, tea), fatty foods, citrus fruits and all other foods and drinks that contain acid and that seem to increase the problems.   Avoid bending over, especially after eating OR STRAINING. Anything that increases the pressure in your belly increases the amount of acid that may be pushed up into your esophagus.    High-Fiber Diet A high-fiber diet changes your normal diet to include more whole grains, legumes, fruits, and vegetables. Changes in the diet involve replacing refined carbohydrates with unrefined foods. The calorie level of the diet is essentially unchanged. The Dietary Reference Intake (recommended amount) for adult males is 38 grams per day. For adult females, it is 25 grams per day. Pregnant and lactating women should consume 28 grams of fiber per day. Fiber is the intact part of a plant that is not broken down during digestion. Functional  fiber is fiber that has been isolated from the plant to provide a beneficial effect in the body. PURPOSE  Increase stool bulk.   Ease and regulate bowel movements.   Lower cholesterol.  INDICATIONS THAT YOU NEED MORE FIBER  Constipation and hemorrhoids.   Uncomplicated diverticulosis (intestine condition) and irritable bowel syndrome.   Weight management.   As a protective measure against hardening of the arteries (atherosclerosis), diabetes, and cancer.   GUIDELINES FOR INCREASING FIBER IN THE DIET  Start adding fiber to the diet slowly. A gradual increase of about 5 more grams (2 slices of whole-wheat bread, 2 servings of most fruits or vegetables, or 1 bowl of high-fiber cereal) per day is best. Too rapid an increase in fiber may result in constipation, flatulence, and bloating.   Drink enough water and fluids to keep your urine clear or pale yellow. Water, juice, or caffeine-free drinks are recommended. Not drinking enough fluid may cause constipation.   Eat a variety of high-fiber foods rather than one type of fiber.   Try to increase your intake of fiber through using high-fiber foods rather than fiber pills or supplements that contain small amounts of fiber.   The goal is to change the types of food eaten. Do not supplement your present diet with high-fiber foods, but replace foods in your present diet.  INCLUDE A VARIETY OF FIBER SOURCES  Replace refined and processed grains with whole grains, canned fruits with fresh fruits, and incorporate other fiber sources. White rice, white breads, and most bakery goods contain little or no fiber.   Brown whole-grain rice, buckwheat oats, and many fruits and vegetables are all good sources of fiber. These include: broccoli, Brussels sprouts, cabbage, cauliflower, beets, sweet potatoes, white potatoes (skin on), carrots, tomatoes, eggplant, squash, berries, fresh fruits, and dried fruits.   Cereals appear to be the richest source of  fiber. Cereal fiber is found in whole grains and bran. Bran is the fiber-rich outer coat of cereal grain, which is largely removed in refining. In whole-grain cereals, the bran remains. In breakfast cereals, the largest amount of fiber is found in those with "bran" in their names. The fiber content is sometimes indicated on the label.   You may need to include additional fruits and vegetables each day.   In baking, for 1  cup white flour, you may use the following substitutions:   1 cup whole-wheat flour minus 2 tablespoons.   1/2 cup white flour plus 1/2 cup whole-wheat flour.   Low-Fat Diet BREADS, CEREALS, PASTA, RICE, DRIED PEAS, AND BEANS These products are high in carbohydrates and most are low in fat. Therefore, they can be increased in the diet as substitutes for fatty foods. They too, however, contain calories and should not be eaten in excess. Cereals can be eaten for snacks as well as for breakfast.  Include foods that contain fiber (fruits, vegetables, whole grains, and legumes). Research shows that fiber may lower blood cholesterol levels, especially the water-soluble fiber found in fruits, vegetables, oat products, and legumes. FRUITS AND VEGETABLES It is good to eat fruits and vegetables. Besides being sources of fiber, both are rich in vitamins and some minerals. They help you get the daily allowances of these nutrients. Fruits and vegetables can be used for snacks and desserts. MEATS Limit lean meat, chicken, [place], and fish to no more than 6 ounces per day. Beef, Pork, and Lamb Use lean cuts of beef, pork, and lamb. Lean cuts include:  Extra-lean ground beef.  Arm roast.  Sirloin tip.  Center-cut ham.  Round steak.  Loin chops.  Rump roast.  Tenderloin.  Trim all fat off the outside of meats before cooking. It is not necessary to severely decrease the intake of red meat, but lean choices should be made. Lean meat is rich in protein and contains a highly absorbable form  of iron. Premenopausal women, in particular, should avoid reducing lean red meat because this could increase the risk for low red blood cells (iron-deficiency anemia). The organ meats, such as liver, sweetbreads, kidneys, and brain are very rich in cholesterol. They should be limited. Chicken and [place] These are good sources of protein. The fat of poultry can be reduced by removing the skin and underlying fat layers before cooking. Chicken and [place] can be substituted for lean red meat in the diet. Poultry should not be fried or covered with high-fat sauces. Fish and Shellfish Fish is a good source of protein. Shellfish contain cholesterol, but they usually are low in saturated fatty acids. The preparation of fish is important. Like chicken and [place], they should not be fried or covered with high-fat sauces. EGGS Egg whites contain no fat or cholesterol. They can be eaten often. Try 1 to 2 egg whites instead of whole eggs in recipes or use egg substitutes that do not contain yolk. MILK AND DAIRY PRODUCTS Use skim or 1% milk instead of 2% or whole milk. Decrease whole milk, natural, and processed cheeses. Use nonfat or low-fat (2%) cottage cheese or low-fat cheeses made from vegetable oils. Choose nonfat or low-fat (1 to 2%) yogurt. Experiment with evaporated skim milk in recipes that call for heavy cream. Substitute low-fat yogurt or low-fat cottage cheese for sour cream in dips and salad dressings. Have at least 2 servings of low-fat dairy products, such as 2 glasses of skim (or 1%) milk each day to help get your daily calcium intake.  FATS AND OILS Reduce the total intake of fats, especially saturated fat. Butterfat, lard, and beef fats are high in saturated fat and cholesterol. These should be avoided as much as possible. Vegetable fats do not contain cholesterol, but certain vegetable fats, such as coconut oil, palm oil, and palm kernel oil are very high in saturated fats. These should be limited.  These fats are often used in  bakery goods, processed foods, popcorn, oils, and nondairy creamers. Vegetable shortenings and some peanut butters contain hydrogenated oils, which are also saturated fats. Read the labels on these foods and check for saturated vegetable oils. Unsaturated vegetable oils and fats do not raise blood cholesterol. However, they should be limited because they are fats and are high in calories. Total fat should still be limited to 30% of your daily caloric intake. Desirable liquid vegetable oils are corn oil, cottonseed oil, olive oil, canola oil, safflower oil, soybean oil, and sunflower oil. Peanut oil is not as good, but small amounts are acceptable. Buy a heart-healthy tub margarine that has no partially hydrogenated oils in the ingredients. Mayonnaise and salad dressings often are made from unsaturated fats, but they should also be limited because of their high calorie and fat content. Seeds, nuts, peanut butter, olives, and avocados are high in fat, but the fat is mainly the unsaturated type. These foods should be limited mainly to avoid excess calories and fat. OTHER EATING TIPS Snacks  Most sweets should be limited as snacks. They tend to be rich in calories and fats, and their caloric content outweighs their nutritional value. Some good choices in snacks are graham crackers, melba toast, soda crackers, bagels (no egg), English muffins, fruits, and vegetables. These snacks are preferable to snack crackers, [place] fries, and chips. Popcorn should be air-popped or cooked in small amounts of liquid vegetable oil. Desserts Eat fruit, low-fat yogurt, and fruit ices. AVOID pastries, cake, and cookies. Sherbet, angel food cake, gelatin dessert, frozen low-fat yogurt, or other frozen products that do not contain saturated fat (pure fruit juice bars, frozen ice pops) are also acceptable.  COOKING METHODS Choose those methods that use little or no fat. They include: Poaching.   Braising.  Steaming.  Grilling.  Baking.  Stir-frying.  Broiling.  Microwaving.  Foods can be cooked in a nonstick pan without added fat, or use a nonfat cooking spray in regular cookware. Limit fried foods and avoid frying in saturated fat. Add moisture to lean meats by using water, broth, cooking wines, and other nonfat or low-fat sauces along with the cooking methods mentioned above. Soups and stews should be chilled after cooking. The fat that forms on top after a few hours in the refrigerator should be skimmed off. When preparing meals, avoid using excess salt. Salt can contribute to raising blood pressure in some people. EATING AWAY FROM HOME Order entres, potatoes, and vegetables without sauces or butter. When meat exceeds the size of a deck of cards (3 to 4 ounces), the rest can be taken home for another meal. Choose vegetable or fruit salads and ask for low-calorie salad dressings to be served on the side. Use dressings sparingly. Limit high-fat toppings, such as bacon, crumbled eggs, cheese, sunflower seeds, and olives. Ask for heart-healthy tub margarine instead of butter.  Hemorrhoids Hemorrhoids are dilated (enlarged) veins around the rectum. Sometimes clots will form in the veins. This makes them swollen and painful. These are called thrombosed hemorrhoids. Causes of hemorrhoids include:  Constipation.   Straining to have a bowel movement.   HEAVY LIFTING HOME CARE INSTRUCTIONS  Eat a well balanced diet and drink 6 to 8 glasses of water every day to avoid constipation. You may also use a bulk laxative.   Avoid straining to have bowel movements.   Keep anal area dry and clean.   Do not use a donut shaped pillow or sit on the toilet for long periods. This increases  blood pooling and pain.   Move your bowels when your body has the urge; this will require less straining and will decrease pain and pressure.

## 2012-01-01 NOTE — Op Note (Signed)
[facility] [address] [place], [place]  [address]  ENDOSCOPY PROCEDURE REPORT  PATIENT:  Ashley Powell, Ashley Powell  MR#:  [id number] BIRTHDATE:  1961/07/08, 50 yrs. old  GENDER:  female  ENDOSCOPIST:  Jonette Eva, MD Referred by:  Milinda Antis, M.D.  PROCEDURE DATE:  01/01/2012 PROCEDURE:  EGD with biopsy, 432[address] ASA CLASS: INDICATIONS:  abdominal pain, diarrhea  MEDICATIONS:   MAC sedation, administered by CRNA TOPICAL ANESTHETIC:  Cetacaine Spray  DESCRIPTION OF PROCEDURE:     Physical exam was performed. Informed consent was obtained from the patient after explaining the benefits, risks, and alternatives to the procedure.  The patient was connected to the monitor and placed in the left lateral position.  Continuous oxygen was provided by nasal cannula and IV medicine administered through an indwelling cannula.  After administration of sedation, the patient's esophagus was intubated and the  endoscope was advanced under direct visualization to the second portion of the duodenum.  The scope was removed slowly by carefully examining the color, texture, anatomy, and integrity of the mucosa on the way out.  The patient was recovered in endoscopy and discharged home in satisfactory condition. 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  A 1-2 M hiatal hernia was found.  Mild gastritis was found  BIOPSIED VIA COLD FORCEPS. NL DUODENUM BIOPSIES OBTAINE VIA COLD FORCEPS TO EVALUATE FOR CELIAC SPRUE.  COMPLICATIONS:    None  ENDOSCOPIC IMPRESSION: 1) Stricture in the distal esophagus 2) Hiatal hernia 3) abd pain may be due to gastritis, GERD, OR IBS-D 4) SMALL HIATAL HERNIA RECOMMENDATIONS: LOW FAT/HIGH FIBER DIET LOSE WEIGHT HOLD ASA/NSAID FOR 2 WEEKS ADD BENTYL 10 MG QAC AND HS & OMP BID FPR 3 MOS THEN QD OPV IN 4 MOS  REPEAT EXAM:  No  ______________________________ Jonette Eva, MD  CC:  n. eSIGNED:   Raina Sole at  01/01/2012 03:27 PM  [place] Abbot, [phone]

## 2012-01-01 NOTE — Anesthesia Postprocedure Evaluation (Signed)
  Anesthesia Post-op Note  Patient: Ashley Powell  Procedure(s) Performed: Procedure(s) (LRB): COLONOSCOPY WITH PROPOFOL (N/A) ESOPHAGOGASTRODUODENOSCOPY (EGD) WITH PROPOFOL (N/A)  Patient Location: PACU  Anesthesia Type: MAC  Level of Consciousness: awake  Airway and Oxygen Therapy: Patient Spontanous Breathing and Patient connected to face mask oxygen  Post-op Pain: none  Post-op Assessment: Post-op Vital signs reviewed, Patient's Cardiovascular Status Stable, Respiratory Function Stable, Patent Airway and No signs of Nausea or vomiting  Post-op Vital Signs: Reviewed and stable  Complications: No apparent anesthesia complications

## 2012-01-01 NOTE — Op Note (Signed)
[facility] [address] [place], [place]  [address]  COLONOSCOPY PROCEDURE REPORT  PATIENT:  Ashley, Powell  MR#:  [id number] BIRTHDATE:  Jan 27, 1961, 50 yrs. old  GENDER:  female  ENDOSCOPIST:  Jonette Eva, MD REF. BY:  Milinda Antis, M.D. ASSISTANT:  PROCEDURE DATE:  01/01/2012 PROCEDURE:  Colonoscopy with biopsy  INDICATIONS:  DIARRHEA FOR YEARS ASSOCIATED WITH ABDOMINAL PAIN. NO RECTAL BLEEDING.  MEDICATIONS:   MAC sedation, administered by CRNA  DESCRIPTION OF PROCEDURE:    Physical exam was performed. Informed consent was obtained from the patient after explaining the benefits, risks, and alternatives to procedure.  The patient was connected to monitor and placed in left lateral position. Continuous oxygen was provided by nasal cannula and IV medicine administered through an indwelling cannula.  After administration of sedation and rectal exam, the patient's rectum was intubated and the  colonoscope was advanced under direct visualization to the cecum.  The scope was removed slowly by carefully examining the color, texture, anatomy, and integrity mucosa on the way out. The patient was recovered in endoscopy and discharged home in satisfactory condition. <<PROCEDUREIMAGES>>  FINDINGS:  There were 4 SESSILE 2-4 MM polyps identified and removed. in the sigmoid colon.  There were FORU 2-4 MM SESSILE polyps identified and removed. in the rectum.  POLYPS REMOVED VIA COLD FORCEPS. LARGE Internal Hemorrhoids were found.  PREP QUALITY: EXCELLENT CECAL W/D TIME:    17 minutes  COMPLICATIONS:    None  ENDOSCOPIC IMPRESSION: 1) Polyps, multiple in the sigmoid colon 2) Polyps, multiple in the rectum 3) Internal hemorrhoids  RECOMMENDATIONS: AWAIT BIOPSIES LOW FAT/HIGH FIBER DIET OPV IN 4 MOS TCS IN 10 YEARS WITH OVERTUBE/PEDS SCOPE  REPEAT EXAM:  No  ______________________________ Jonette Eva, MD  CC:  Milinda Antis, M.D.  n. eSIGNED:   Bernyce Brimley at 01/01/2012 10:13 AM  [place]bot, [phone]

## 2012-01-01 NOTE — Anesthesia Preprocedure Evaluation (Addendum)
Anesthesia Evaluation  Patient identified by MRN, date of birth, ID band Patient awake    Reviewed: Allergy & Precautions, H&P , NPO status , Patient's Chart, lab work & pertinent test results  History of Anesthesia Complications (+) PROLONGED EMERGENCE  Airway Mallampati: I TM Distance: >3 FB     Dental  (+) Teeth Intact and Partial Lower   Pulmonary COPDCurrent Smoker,  breath sounds clear to auscultation        Cardiovascular negative cardio ROS  Rhythm:Regular Rate:Normal     Neuro/Psych PSYCHIATRIC DISORDERS Anxiety Depression Bipolar Disorder    GI/Hepatic PUD, GERD-  Medicated,  Endo/Other    Renal/GU      Musculoskeletal   Abdominal   Peds  Hematology   Anesthesia Other Findings   Reproductive/Obstetrics                           Anesthesia Physical Anesthesia Plan  ASA: III  Anesthesia Plan: MAC   Post-op Pain Management:    Induction: Intravenous  Airway Management Planned: Simple Face Mask  Additional Equipment:   Intra-op Plan:   Post-operative Plan:   Informed Consent: I have reviewed the patients History and Physical, chart, labs and discussed the procedure including the risks, benefits and alternatives for the proposed anesthesia with the patient or authorized representative who has indicated his/her understanding and acceptance.     Plan Discussed with:   Anesthesia Plan Comments:         Anesthesia Quick Evaluation

## 2012-01-07 ENCOUNTER — Ambulatory Visit (INDEPENDENT_AMBULATORY_CARE_PROVIDER_SITE_OTHER): Payer: BC Managed Care – PPO | Admitting: Family Medicine

## 2012-01-07 ENCOUNTER — Encounter: Payer: Self-pay | Admitting: Family Medicine

## 2012-01-07 VITALS — BP 124/72 | HR 110 | Resp 18 | Ht 65.5 in | Wt 165.1 lb

## 2012-01-07 DIAGNOSIS — T148 Other injury of unspecified body region: Secondary | ICD-10-CM

## 2012-01-07 DIAGNOSIS — R109 Unspecified abdominal pain: Secondary | ICD-10-CM

## 2012-01-07 DIAGNOSIS — T148XXA Other injury of unspecified body region, initial encounter: Secondary | ICD-10-CM

## 2012-01-07 DIAGNOSIS — R259 Unspecified abnormal involuntary movements: Secondary | ICD-10-CM

## 2012-01-07 DIAGNOSIS — R197 Diarrhea, unspecified: Secondary | ICD-10-CM

## 2012-01-07 DIAGNOSIS — G8929 Other chronic pain: Secondary | ICD-10-CM

## 2012-01-07 MED ORDER — DOXYCYCLINE HYCLATE 100 MG PO TABS: 100.0000 mg | ORAL_TABLET | Freq: Two times a day (BID) | ORAL | Status: AC

## 2012-01-07 NOTE — Patient Instructions (Signed)
I will obtain MRI of your brain for the shaking  CT scan of your stomach for the pain  Take the antibiotics for the cat scratch  Take the hydrocodone for severe abdominal pain as needed We will call with results

## 2012-01-09 ENCOUNTER — Telehealth: Payer: Self-pay | Admitting: Gastroenterology

## 2012-01-09 NOTE — Telephone Encounter (Signed)
Results Cc to PCP & reminders are in the computer 

## 2012-01-09 NOTE — Telephone Encounter (Signed)
Please call pt. She had HYPERPLASTIC POLYPS removed from her colon. HER stomach Bx gastritis. HER RANDOM COLON AND SMALL BOWEL BIOPSIES ARE NORMAL. Continue OMP 30 MINUTES PRIOR TO MEALS BID FOR 3 MOS THEN DAILY.   CONTINUE BENTYL 30 MINUTES PRIOR TO MEALS AND AT BEDTIME.  STOP ASPIRIN PRODUCTS FOR 1 MORE WEEK.  AVOID ITEMS THAT TRIGGER GASTRITIS.   FOLLOW A HIGH FIBER/LOW FAT DIET. AVOID ITEMS THAT CAUSE BLOATING.   CONTINUE WEIGHT LOSS EFFORTS.  FOLLOW UP IN 4 MOS WITH AS OR SLF E 30 VISIT.  NEXT COLONOSCOPY IN 10 YEARS.

## 2012-01-10 ENCOUNTER — Encounter: Payer: Self-pay | Admitting: Family Medicine

## 2012-01-10 ENCOUNTER — Telehealth: Payer: Self-pay | Admitting: Family Medicine

## 2012-01-10 DIAGNOSIS — G8929 Other chronic pain: Secondary | ICD-10-CM | POA: Insufficient documentation

## 2012-01-10 DIAGNOSIS — T148XXA Other injury of unspecified body region, initial encounter: Secondary | ICD-10-CM | POA: Insufficient documentation

## 2012-01-10 DIAGNOSIS — R259 Unspecified abnormal involuntary movements: Secondary | ICD-10-CM | POA: Insufficient documentation

## 2012-01-10 NOTE — Telephone Encounter (Signed)
Called and informed pt.  

## 2012-01-10 NOTE — Assessment & Plan Note (Signed)
Seen by GI, Check CT abd for worsened pain; labs unremarkable

## 2012-01-10 NOTE — Assessment & Plan Note (Signed)
Treat with antibiotics,keep clean and covered until healed

## 2012-01-10 NOTE — Progress Notes (Signed)
  Subjective:    Patient ID: Ashley Powell, female    DOB: 1961/08/05, 51 y.o.   MRN: [id number]  HPI Pt presents with increased abdominal pain and diarrhea, she has been seen by GI with negative worse up thus far, s/p colonoscopy, pain mostly in lower quadrants, she has evacuations after eating, denies blood in stool, she is waiting to here back from biopsy done, she is aware of polyp and hiatal hernia Bitten by a neighborhood cat and now has some swelling at the site of the bite Shaking all over. She's had a tremor for the past year or so however does get worse in the past 6 months. She is dropping items and have no control over it. She denies any seizure activity and understands he has anxiety with shaking persist despite medications for her mood.  Review of Systems - per above   GEN- denies fatigue, fever, weight loss,weakness, recent illness HEENT- denies eye drainage, change in vision, nasal discharge, CVS- denies chest pain, palpitations RESP- denies SOB, cough, wheeze ABD- denies N/V, +change in stools, +abd pain GU- denies dysuria, hematuria, dribbling, incontinence MSK- denies joint pain, muscle aches, injury Neuro- denies headache, dizziness, syncope, seizure activity       Objective:   Physical Exam GEN- NAD, alert and oriented x3 HEENT- PERRL, EOMI, non injected sclera, pink conjunctiva, MMM, oropharynx clear, no nystagmus Neck- Supple, no thyromegaly, no bruit CVS- RRR, no murmur RESP-CTAB ABD-NABS,soft, TTP lower quadrants, no rebound ,no gaurding, no masses felt EXT- No edema, right index finger- mild edema, erythema and bite mark noted, no drainage from lesion Pulses- Radial, DP- 2+ Psych- +anxious appearing,not depressed appearing, no apparent hallucinations Neuro- tremor at rest, worse with activity, CNII-XII grossly in tact, normal tone, motor equal bilat        Assessment & Plan:

## 2012-01-10 NOTE — Assessment & Plan Note (Signed)
Abnormal movements of upper and lower ext, obtain MRI, this does not sound to be classic of seizure

## 2012-01-10 NOTE — Telephone Encounter (Signed)
Patient is aware 

## 2012-01-10 NOTE — Assessment & Plan Note (Signed)
Unclear cause, she does have IBS, check CTabd, await biopsies

## 2012-01-15 ENCOUNTER — Encounter (HOSPITAL_COMMUNITY): Payer: Self-pay

## 2012-01-15 ENCOUNTER — Ambulatory Visit (HOSPITAL_COMMUNITY)
Admission: RE | Admit: 2012-01-15 | Discharge: 2012-01-15 | Disposition: A | Discharge status: Home / Self Care | Payer: BC Managed Care – PPO | Source: Ambulatory Visit | Attending: Family Medicine | Admitting: Family Medicine

## 2012-01-15 DIAGNOSIS — R259 Unspecified abnormal involuntary movements: Secondary | ICD-10-CM

## 2012-01-15 DIAGNOSIS — R110 Nausea: Secondary | ICD-10-CM | POA: Insufficient documentation

## 2012-01-15 DIAGNOSIS — R1031 Right lower quadrant pain: Secondary | ICD-10-CM | POA: Insufficient documentation

## 2012-01-15 DIAGNOSIS — E279 Disorder of adrenal gland, unspecified: Secondary | ICD-10-CM | POA: Insufficient documentation

## 2012-01-15 DIAGNOSIS — R197 Diarrhea, unspecified: Secondary | ICD-10-CM | POA: Insufficient documentation

## 2012-01-15 DIAGNOSIS — G8929 Other chronic pain: Secondary | ICD-10-CM

## 2012-01-15 DIAGNOSIS — R634 Abnormal weight loss: Secondary | ICD-10-CM | POA: Insufficient documentation

## 2012-01-15 MED ORDER — IOHEXOL 300 MG/ML  SOLN
100.0000 mL | Freq: Once | INTRAMUSCULAR | Status: AC | PRN
  Administered 2012-01-15: 100 mL via INTRAVENOUS

## 2012-01-16 ENCOUNTER — Telehealth: Payer: Self-pay | Admitting: Family Medicine

## 2012-01-16 ENCOUNTER — Ambulatory Visit (HOSPITAL_COMMUNITY): Payer: BC Managed Care – PPO

## 2012-01-16 DIAGNOSIS — R9089 Other abnormal findings on diagnostic imaging of central nervous system: Secondary | ICD-10-CM

## 2012-01-16 DIAGNOSIS — R259 Unspecified abnormal involuntary movements: Secondary | ICD-10-CM

## 2012-01-16 NOTE — Telephone Encounter (Signed)
Given results, Neuro referral

## 2012-01-18 NOTE — Telephone Encounter (Signed)
Dr Jeanice Lim spoke with pt

## 2012-02-18 HISTORY — PX: TRANSTHORACIC ECHOCARDIOGRAM: SHX275

## 2012-02-29 ENCOUNTER — Other Ambulatory Visit: Payer: Self-pay | Admitting: Neurology

## 2012-02-29 DIAGNOSIS — I639 Cerebral infarction, unspecified: Secondary | ICD-10-CM

## 2012-03-05 ENCOUNTER — Ambulatory Visit (HOSPITAL_COMMUNITY)
Admission: RE | Admit: 2012-03-05 | Discharge: 2012-03-05 | Disposition: A | Discharge status: Home / Self Care | Payer: BC Managed Care – PPO | Source: Ambulatory Visit | Attending: Neurology | Admitting: Neurology

## 2012-03-05 DIAGNOSIS — E7800 Pure hypercholesterolemia, unspecified: Secondary | ICD-10-CM | POA: Insufficient documentation

## 2012-03-05 DIAGNOSIS — I6350 Cerebral infarction due to unspecified occlusion or stenosis of unspecified cerebral artery: Secondary | ICD-10-CM | POA: Insufficient documentation

## 2012-03-05 DIAGNOSIS — I6529 Occlusion and stenosis of unspecified carotid artery: Secondary | ICD-10-CM | POA: Insufficient documentation

## 2012-03-05 DIAGNOSIS — Z87891 Personal history of nicotine dependence: Secondary | ICD-10-CM | POA: Insufficient documentation

## 2012-03-05 DIAGNOSIS — I639 Cerebral infarction, unspecified: Secondary | ICD-10-CM

## 2012-03-11 ENCOUNTER — Other Ambulatory Visit (HOSPITAL_COMMUNITY): Payer: BC Managed Care – PPO

## 2012-03-12 ENCOUNTER — Ambulatory Visit (HOSPITAL_COMMUNITY)
Admission: RE | Admit: 2012-03-12 | Discharge: 2012-03-12 | Disposition: A | Discharge status: Home / Self Care | Payer: BC Managed Care – PPO | Source: Ambulatory Visit | Attending: Neurology | Admitting: Neurology

## 2012-03-12 DIAGNOSIS — J449 Chronic obstructive pulmonary disease, unspecified: Secondary | ICD-10-CM | POA: Insufficient documentation

## 2012-03-12 DIAGNOSIS — I6789 Other cerebrovascular disease: Secondary | ICD-10-CM | POA: Insufficient documentation

## 2012-03-12 DIAGNOSIS — F17200 Nicotine dependence, unspecified, uncomplicated: Secondary | ICD-10-CM | POA: Insufficient documentation

## 2012-03-12 DIAGNOSIS — J4489 Other specified chronic obstructive pulmonary disease: Secondary | ICD-10-CM | POA: Insufficient documentation

## 2012-03-12 NOTE — Progress Notes (Signed)
*  PRELIMINARY RESULTS* Echocardiogram 2D Echocardiogram has been performed.  Ashley Powell 03/12/2012, 10:45 AM

## 2012-03-17 ENCOUNTER — Other Ambulatory Visit: Payer: Self-pay

## 2012-03-17 DIAGNOSIS — I6529 Occlusion and stenosis of unspecified carotid artery: Secondary | ICD-10-CM

## 2012-03-21 DIAGNOSIS — H26499 Other secondary cataract, unspecified eye: Secondary | ICD-10-CM | POA: Insufficient documentation

## 2012-03-24 ENCOUNTER — Encounter: Payer: Self-pay | Admitting: Vascular Surgery

## 2012-03-25 ENCOUNTER — Encounter: Payer: Self-pay | Admitting: Vascular Surgery

## 2012-03-25 ENCOUNTER — Ambulatory Visit (INDEPENDENT_AMBULATORY_CARE_PROVIDER_SITE_OTHER): Payer: BC Managed Care – PPO | Admitting: Vascular Surgery

## 2012-03-25 ENCOUNTER — Other Ambulatory Visit (INDEPENDENT_AMBULATORY_CARE_PROVIDER_SITE_OTHER): Payer: BC Managed Care – PPO | Admitting: *Deleted

## 2012-03-25 VITALS — BP 145/89 | HR 78 | Temp 98.4°F | Ht 65.0 in | Wt 177.0 lb

## 2012-03-25 DIAGNOSIS — I6529 Occlusion and stenosis of unspecified carotid artery: Secondary | ICD-10-CM

## 2012-03-25 NOTE — Progress Notes (Signed)
[facility] [place]   Vascular Consult Note    Patient name: Ashley Powell MRN: [id number] DOB: 11-15-1960 Sex: female   Referred by: [facility]  Reason for referral: Left carotid stenosis  HISTORY OF PRESENT ILLNESS: Patient is a very pleasant 51 year old with a complex past history. She had an event in 2008 with unclear etiology. In her history this is described as potential panic attack versus stroke. Work up at that time is not available. She's continued to have neurologic difficulties since that time with tremor memory loss but no focal symptoms. Recent workup revealed severe left internal carotid artery stenosis and MRI of her brain reviewed left brain chronic changes of old stroke. She also underwent echocardiogram which showed normal ventricular function and normal bubble study. There was also potential for multiple sclerosis in her shoulder port she underwent a spinal tap which was nondiagnostic. She does report chronic headache and fatigue but no focal deficits  Past Medical History  Diagnosis Date  . Depression   . COPD (chronic obstructive pulmonary disease) 2010 ?  Marland Kitchen GERD (gastroesophageal reflux disease)   . Bipolar 1 disorder   . Anxiety   . Panic attacks   . DU (duodenal ulcer)     age 70  . Hypercholesteremia   . Malignant melanoma 1998    Stage III-left shoulder  . Stroke   . Peripheral vascular disease     Past Surgical History  Procedure Date  . Tubal ligation 1984  . Cholecystectomy 2001  . Cataract extraction, bilateral 2012  . Melanoma excision 1998    [facility]  . Lymph node dissection 1998    History   Social History  . Marital Status: Married    Spouse Name: N/A    Number of Children: 1  . Years of Education: N/A   Occupational History  . disabled-police dept    Social History Main Topics  . Smoking status: Current Everyday Smoker -- 0.5 packs/day for 22 years    Types: Cigarettes  . Smokeless tobacco:  Never Used   Comment: pt states that she likes to smoke  . Alcohol Use: No  . Drug Use: No  . Sexually Active: Not on file   Other Topics Concern  . Not on file   Social History Narrative  . No narrative on file    Family History  Problem Relation Age of Onset  . Depression Mother   . Hyperlipidemia Mother   . Hypertension Father   . Hyperlipidemia Father   . Heart disease Father     before age 11  . Diabetes Father   . Heart attack Father   . Hypertension Brother   . Heart disease Brother   . Hyperlipidemia Brother   . Depression Brother   . Heart attack Brother     Allergies  Allergen Reactions  . Latex Other (See Comments)    Reaction: cause skin to peel off     Prior to Admission medications   Medication Sig Start Date End Date Taking? Authorizing Provider  Cholecalciferol (VITAMIN D PO) Take by mouth once a week.   Yes Historical Provider, MD  Cyanocobalamin (VITAMIN B-12 CR PO) Take by mouth daily.   Yes Historical Provider, MD  dicyclomine (BENTYL) 10 MG capsule Take 1 capsule (10 mg total) by mouth 4 (four) times daily. 1 po qac and hs 01/01/12 12/31/12 Yes Sandi L Fields, MD  LORazepam (ATIVAN) 1 MG tablet Take 1 mg  by mouth 4 (four) times daily as needed. For anxiety   Yes Historical Provider, MD  omeprazole (PRILOSEC) 20 MG capsule 1 po bid 30 minutes before meals for 3 mos then once daily FOREVER 01/01/12 12/31/12 Yes West Bali, MD  propranolol (INDERAL) 10 MG tablet Take 10 mg by mouth 3 (three) times daily.   Yes Historical Provider, MD  sertraline (ZOLOFT) 100 MG tablet Take 200 mg by mouth every morning.    Yes Historical Provider, MD  traZODone (DESYREL) 100 MG tablet Take 100 mg by mouth at bedtime.    Yes Historical Provider, MD  ziprasidone (GEODON) 40 MG capsule Take 40 mg by mouth 2 (two) times daily with a meal.   Yes Historical Provider, MD  divalproex (DEPAKOTE) 500 MG DR tablet Take 500-1,000 mg by mouth 2 (two) times daily. Take 1 tablet (500  mg) in the morning and 2 tablets (1000 mg) at bedtime    Historical Provider, MD  hydrOXYzine (ATARAX/VISTARIL) 50 MG tablet Take 50 mg by mouth at bedtime.    Historical Provider, MD  methylphenidate (RITALIN) 20 MG tablet Take 10 mg by mouth 3 (three) times daily.    Historical Provider, MD  Probiotic Product (ALIGN) 4 MG CAPS Take 1 capsule by mouth every morning.    Historical Provider, MD  rosuvastatin (CRESTOR) 10 MG tablet Take 1 tablet (10 mg total) by mouth at bedtime. 12/12/11 12/11/12  Salley Scarlet, MD     REVIEW OF SYSTEMS: Cardiovascular: No chest pain, chest pressure, palpitations, orthopnea, or dyspnea on exertion. No claudication or rest pain,  No history of DVT or phlebitis. Pulmonary: No productive cough, asthma or wheezing. Neurologic: No weakness, paresthesias, , or amaurosis.  Does report occasional slurred speech when she is tired and has reported dizziness Hematologic: No bleeding problems or clotting disorders. Musculoskeletal: No joint pain or joint swelling. Gastrointestinal: No blood in stool or hematemesis Genitourinary: No dysuria or hematuria. Psychiatric:: History of major depression Integumentary: No rashes or ulcers. Constitutional: No fever or chills.  PHYSICAL EXAMINATION:  Filed Vitals:   03/25/12 1404  BP: 145/89  Pulse: 78  Temp:     General: The patient appears their stated age. Pulmonary: There is a good air exchange bilaterally without wheezing or rales. Abdomen: Soft and non-tender with normal pitch bowel sounds. Musculoskeletal: There are no major deformities.  There is no significant extremity pain. Neurologic: No focal weakness or paresthesias are detected, Skin: There are no ulcer or rashes noted. Psychiatric: The patient has normal affect. Cardiovascular: There is a regular rate and rhythm without significant murmur appreciated. Carotid arteries without bruits bilaterally Diagnostic Studies: Carotid duplex in our office reveal  upper end of 60-79% stenosis in the left internal carotid artery areas in the normal level of the bifurcation with normal internal carotid artery distal to this.  Outside Studies/Documentation Historical records were reviewed.  They showed reviewed extensive medical records from her primary care physician and neurologist [facility] workup as described above  Medication Changes: None  Assessment:  Left internal carotid artery stenosis in the 75-80% range by an outlying study and by her study here today  Plan: Had extensive discussion with the patient and her family present. It is clear that she has significant left internal carotid R. stenosis. It is unclear as to whether or not this has anything to do with her neurologic event in the year 2008. 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 I discussed  the option of continued carotid duplex surveillance versus carotid endarterectomy for treatment. I feel that her young age and with a 75-80% stenosis and probable embolic event seen on her right brain MRI and I would recommend endarterectomy for reduction of stroke risk. She has not comfortable with surveillance only. She wish to proceed with surgery we'll schedule this at her convenience and one week. I described the procedure including a 1-2% risk of stroke with surgery in the unlikely risk of cranial nerve injury. She understands this is typically a one night hospitalization.  EARLY, TODD 8/6/20134:34 PM

## 2012-03-26 ENCOUNTER — Encounter: Payer: Self-pay | Admitting: Vascular Surgery

## 2012-03-26 ENCOUNTER — Other Ambulatory Visit: Payer: Self-pay

## 2012-03-26 ENCOUNTER — Encounter (HOSPITAL_COMMUNITY): Payer: Self-pay | Admitting: Pharmacy Technician

## 2012-03-28 ENCOUNTER — Encounter (HOSPITAL_COMMUNITY): Payer: Self-pay

## 2012-03-28 ENCOUNTER — Encounter (HOSPITAL_COMMUNITY)
Admission: RE | Admit: 2012-03-28 | Discharge: 2012-03-28 | Disposition: A | Discharge status: Home / Self Care | Payer: BC Managed Care – PPO | Source: Ambulatory Visit | Attending: Vascular Surgery | Admitting: Vascular Surgery

## 2012-03-28 ENCOUNTER — Ambulatory Visit (HOSPITAL_COMMUNITY)
Admission: RE | Admit: 2012-03-28 | Discharge: 2012-03-28 | Disposition: A | Discharge status: Home / Self Care | Payer: BC Managed Care – PPO | Source: Ambulatory Visit | Attending: Vascular Surgery | Admitting: Vascular Surgery

## 2012-03-28 DIAGNOSIS — Z01812 Encounter for preprocedural laboratory examination: Secondary | ICD-10-CM | POA: Insufficient documentation

## 2012-03-28 DIAGNOSIS — Z01818 Encounter for other preprocedural examination: Secondary | ICD-10-CM | POA: Insufficient documentation

## 2012-03-28 DIAGNOSIS — Z01810 Encounter for preprocedural cardiovascular examination: Secondary | ICD-10-CM | POA: Insufficient documentation

## 2012-03-28 HISTORY — DX: Adverse effect of unspecified anesthetic, initial encounter: T41.45XA

## 2012-03-28 HISTORY — DX: Other complications of anesthesia, initial encounter: T88.59XA

## 2012-03-28 HISTORY — DX: Family history of other specified conditions: Z84.89

## 2012-03-28 LAB — PROTIME-INR: Prothrombin Time: 14.3 seconds (ref 11.6–15.2)

## 2012-03-28 LAB — CBC
Hemoglobin: 14.4 g/dL (ref 12.0–15.0)
MCH: 30.1 pg (ref 26.0–34.0)
MCHC: 34.4 g/dL (ref 30.0–36.0)
MCV: 87.3 fL (ref 78.0–100.0)
RBC: 4.79 MIL/uL (ref 3.87–5.11)

## 2012-03-28 LAB — COMPREHENSIVE METABOLIC PANEL
ALT: 18 U/L (ref 0–35)
BUN: 12 mg/dL (ref 6–23)
CO2: 28 mEq/L (ref 19–32)
Calcium: 10.1 mg/dL (ref 8.4–10.5)
Creatinine, Ser: 0.63 mg/dL (ref 0.50–1.10)
GFR calc Af Amer: >90 mL/min (ref >90)
GFR calc non Af Amer: >90 mL/min (ref >90)
Glucose, Bld: 90 mg/dL (ref 70–99)
Sodium: 138 mEq/L (ref 135–145)
Total Protein: 7.8 g/dL (ref 6.0–8.3)

## 2012-03-28 LAB — URINALYSIS, ROUTINE W REFLEX MICROSCOPIC
Bilirubin Urine: NEGATIVE
Ketones, ur: NEGATIVE mg/dL
Leukocytes, UA: NEGATIVE
Nitrite: NEGATIVE
Protein, ur: NEGATIVE mg/dL
Urobilinogen, UA: 0.2 mg/dL (ref 0.0–1.0)
pH: 6 (ref 5.0–8.0)

## 2012-03-28 LAB — TYPE AND SCREEN
ABO/RH(D): A POS
Antibody Screen: NEGATIVE

## 2012-03-28 LAB — APTT: aPTT: 33 seconds (ref 24–37)

## 2012-03-28 LAB — SURGICAL PCR SCREEN: MRSA, PCR: NEGATIVE

## 2012-03-28 NOTE — Progress Notes (Addendum)
EKG done at PAT to be reviewed by antesthesia. No EKG available at PCP (Dr. Jeanice Lim in [place]).

## 2012-03-28 NOTE — Pre-Procedure Instructions (Signed)
21 Ashley Powell  03/28/2012   Your procedure is scheduled on:  Wednesday August 14  Report to [facility] at 6:30 AM.  Call this number if you have problems the morning of surgery: [phone]   Remember:   Do not eat food:After Midnight.  May have clear liquids:until Midnight .  Clear liquids include soda, tea, black coffee, apple or grape juice, broth.  Take these medicines the morning of surgery with A SIP OF WATER: Ativan (lorazepam) if needed, omeprazole (Prilosec), propranolol (Inderal), sertraline (Zoloft)   Do not wear jewelry, make-up or nail polish.  Do not wear lotions, powders, or perfumes. You may wear deodorant.  Do not shave 48 hours prior to surgery. Men may shave face and neck.  Do not bring valuables to the hospital.  Contacts, dentures or bridgework may not be worn into surgery.  Leave suitcase in the car. After surgery it may be brought to your room.  For patients admitted to the hospital, checkout time is 11:00 AM the day of discharge.   Patients discharged the day of surgery will not be allowed to drive home.  Name and phone number of your driver: NA  Special Instructions: CHG Shower Use Special Wash: 1/2 bottle night before surgery and 1/2 bottle morning of surgery.   Please read over the following fact sheets that you were given: Pain Booklet, Coughing and Deep Breathing, Blood Transfusion Information and Surgical Site Infection Prevention

## 2012-03-31 NOTE — Consult Note (Signed)
Anesthesia Chart Review:  Patient is a 51 year old female scheduled for left CEA on 04/02/12 by Dr. Arbie Cookey.  History includes smoking, depression, GERD, anxiety/panic attacks, hypercholesterolemia, CVA, duodenal ulcer (age 43), melanoma stage III (left shoulder) '98, PVD, COPD, Bipolar 1 disorder, cataract extraction, tubal ligation, tonsillectomy, cholecystectomy.  She reported that she was difficult to wake up from anesthesia in the past.  PCP is Dr. Milinda Antis in [place].    EKG on 03/28/12 showed NSR, possible LAE, septal infarct (age undetermined).  There are no comparison EKGs done within [facility] or at her PCP office.  No CV symptoms were documented at her PAT visit.  Echo on 03/12/12 showed: - Left ventricle: The cavity size was normal. Wall thickness was normal. Systolic function was normal. The estimated ejection fraction was in the range of 55% to 60%. Wall motion was normal; there were no regional wall motion abnormalities. - Atrial septum: No defect or patent foramen ovale was identified. Echo contrast study showed no right-to-left shunt. Impressions: - Normal saline contrast echo.  She had a negative chest x-ray on 03/28/12.  Labs from 03/28/12 noted.  I've asked the Short Stay staff to verify her menstrual status on the day of surgery to ensure that a serum pregnancy test is no indicated pre-operatively.   Anticipate she can proceed if no significant change in her status.  Shonna Chock, PA-C

## 2012-04-01 MED ORDER — DEXTROSE 5 % IV SOLN
1.5000 g | INTRAVENOUS | Status: AC
  Administered 2012-04-02: 1.5 g via INTRAVENOUS
  Filled 2012-04-01: qty 1.5

## 2012-04-02 ENCOUNTER — Encounter (HOSPITAL_COMMUNITY): Payer: Self-pay | Admitting: Vascular Surgery

## 2012-04-02 ENCOUNTER — Inpatient Hospital Stay (HOSPITAL_COMMUNITY)
Admission: RE | Admit: 2012-04-02 | Discharge: 2012-04-03 | DRG: 838 | Disposition: A | Discharge status: Home / Self Care | Payer: BC Managed Care – PPO | Source: Ambulatory Visit | Attending: Vascular Surgery | Admitting: Vascular Surgery

## 2012-04-02 ENCOUNTER — Ambulatory Visit (HOSPITAL_COMMUNITY): Payer: BC Managed Care – PPO | Admitting: Vascular Surgery

## 2012-04-02 ENCOUNTER — Encounter (HOSPITAL_COMMUNITY)
Admission: RE | Disposition: A | Discharge status: Home / Self Care | Payer: Self-pay | Source: Ambulatory Visit | Attending: Vascular Surgery

## 2012-04-02 ENCOUNTER — Telehealth: Payer: Self-pay | Admitting: Vascular Surgery

## 2012-04-02 DIAGNOSIS — I739 Peripheral vascular disease, unspecified: Secondary | ICD-10-CM | POA: Diagnosis present

## 2012-04-02 DIAGNOSIS — Z8673 Personal history of transient ischemic attack (TIA), and cerebral infarction without residual deficits: Secondary | ICD-10-CM

## 2012-04-02 DIAGNOSIS — F410 Panic disorder [episodic paroxysmal anxiety] without agoraphobia: Secondary | ICD-10-CM | POA: Diagnosis present

## 2012-04-02 DIAGNOSIS — E7800 Pure hypercholesterolemia, unspecified: Secondary | ICD-10-CM | POA: Diagnosis present

## 2012-04-02 DIAGNOSIS — Z85820 Personal history of malignant melanoma of skin: Secondary | ICD-10-CM

## 2012-04-02 DIAGNOSIS — J449 Chronic obstructive pulmonary disease, unspecified: Secondary | ICD-10-CM | POA: Diagnosis present

## 2012-04-02 DIAGNOSIS — F17200 Nicotine dependence, unspecified, uncomplicated: Secondary | ICD-10-CM | POA: Diagnosis present

## 2012-04-02 DIAGNOSIS — Z79899 Other long term (current) drug therapy: Secondary | ICD-10-CM

## 2012-04-02 DIAGNOSIS — J4489 Other specified chronic obstructive pulmonary disease: Secondary | ICD-10-CM | POA: Diagnosis present

## 2012-04-02 DIAGNOSIS — I6529 Occlusion and stenosis of unspecified carotid artery: Principal | ICD-10-CM | POA: Diagnosis present

## 2012-04-02 DIAGNOSIS — K219 Gastro-esophageal reflux disease without esophagitis: Secondary | ICD-10-CM | POA: Diagnosis present

## 2012-04-02 DIAGNOSIS — Z7982 Long term (current) use of aspirin: Secondary | ICD-10-CM

## 2012-04-02 HISTORY — PX: ENDARTERECTOMY: SHX5162

## 2012-04-02 HISTORY — PX: CAROTID ENDARTERECTOMY: SUR193

## 2012-04-02 SURGERY — ENDARTERECTOMY, CAROTID
Anesthesia: General | Site: Neck | Laterality: Left | Wound class: Clean

## 2012-04-02 MED ORDER — OXYCODONE HCL 5 MG PO TABS: 5.0000 mg | ORAL_TABLET | Freq: Four times a day (QID) | ORAL | Status: AC | PRN

## 2012-04-02 MED ORDER — PROPRANOLOL HCL 10 MG PO TABS
10.0000 mg | ORAL_TABLET | Freq: Three times a day (TID) | ORAL | Status: DC
Start: 2012-04-02 — End: 2012-04-03
  Administered 2012-04-02 – 2012-04-03 (×2): 10 mg via ORAL
  Filled 2012-04-02 (×5): qty 1

## 2012-04-02 MED ORDER — KCL IN DEXTROSE-NACL 20-5-0.45 MEQ/L-%-% IV SOLN
INTRAVENOUS | Status: DC
  Administered 2012-04-02: 16:00:00 via INTRAVENOUS
  Filled 2012-04-02 (×3): qty 1000

## 2012-04-02 MED ORDER — VITAMIN D (ERGOCALCIFEROL) 1.25 MG (50000 UNIT) PO CAPS
50000.0000 [IU] | ORAL_CAPSULE | ORAL | Status: DC
  Filled 2012-04-02: qty 1

## 2012-04-02 MED ORDER — METHYLPHENIDATE HCL ER (OSM) 18 MG PO TBCR: 54.0000 mg | EXTENDED_RELEASE_TABLET | Freq: Every day | ORAL | Status: DC

## 2012-04-02 MED ORDER — ROCURONIUM BROMIDE 100 MG/10ML IV SOLN
INTRAVENOUS | Status: DC | PRN
  Administered 2012-04-02: 50 mg via INTRAVENOUS
  Administered 2012-04-02: 10 mg via INTRAVENOUS

## 2012-04-02 MED ORDER — ONDANSETRON HCL 4 MG/2ML IJ SOLN
INTRAMUSCULAR | Status: DC | PRN
  Administered 2012-04-02: 4 mg via INTRAVENOUS

## 2012-04-02 MED ORDER — MORPHINE SULFATE 2 MG/ML IJ SOLN
INTRAMUSCULAR | Status: AC
  Administered 2012-04-02: 2 mg via INTRAVENOUS
  Filled 2012-04-02: qty 1

## 2012-04-02 MED ORDER — CEFUROXIME SODIUM 1.5 G IJ SOLR
1.5000 g | Freq: Two times a day (BID) | INTRAMUSCULAR | Status: DC
  Administered 2012-04-02: 1.5 g via INTRAVENOUS
  Filled 2012-04-02 (×3): qty 1.5

## 2012-04-02 MED ORDER — HYDROMORPHONE HCL PF 1 MG/ML IJ SOLN
0.2500 mg | INTRAMUSCULAR | Status: DC | PRN
  Administered 2012-04-02 (×2): 0.5 mg via INTRAVENOUS

## 2012-04-02 MED ORDER — OXYCODONE HCL 5 MG PO TABS
5.0000 mg | ORAL_TABLET | ORAL | Status: DC | PRN
  Administered 2012-04-02 – 2012-04-03 (×2): 10 mg via ORAL
  Filled 2012-04-02 (×2): qty 2

## 2012-04-02 MED ORDER — GUAIFENESIN-DM 100-10 MG/5ML PO SYRP: 15.0000 mL | ORAL_SOLUTION | ORAL | Status: DC | PRN

## 2012-04-02 MED ORDER — HYDROMORPHONE HCL PF 1 MG/ML IJ SOLN
INTRAMUSCULAR | Status: AC
  Filled 2012-04-02: qty 1

## 2012-04-02 MED ORDER — LACTATED RINGERS IV SOLN
INTRAVENOUS | Status: DC | PRN
  Administered 2012-04-02: 08:00:00 via INTRAVENOUS

## 2012-04-02 MED ORDER — MIDAZOLAM HCL 5 MG/5ML IJ SOLN
INTRAMUSCULAR | Status: DC | PRN
  Administered 2012-04-02: 1 mg via INTRAVENOUS

## 2012-04-02 MED ORDER — PROTAMINE SULFATE 10 MG/ML IV SOLN
INTRAVENOUS | Status: DC | PRN
  Administered 2012-04-02 (×2): 10 mg via INTRAVENOUS
  Administered 2012-04-02: 20 mg via INTRAVENOUS
  Administered 2012-04-02: 10 mg via INTRAVENOUS

## 2012-04-02 MED ORDER — LABETALOL HCL 5 MG/ML IV SOLN: 10.0000 mg | INTRAVENOUS | Status: DC | PRN

## 2012-04-02 MED ORDER — PROPOFOL 10 MG/ML IV EMUL
INTRAVENOUS | Status: DC | PRN
  Administered 2012-04-02: 180 mg via INTRAVENOUS

## 2012-04-02 MED ORDER — METOPROLOL TARTRATE 1 MG/ML IV SOLN: 2.0000 mg | INTRAVENOUS | Status: DC | PRN

## 2012-04-02 MED ORDER — 0.9 % SODIUM CHLORIDE (POUR BTL) OPTIME
TOPICAL | Status: DC | PRN
  Administered 2012-04-02: 2000 mL

## 2012-04-02 MED ORDER — FOLIC ACID 1 MG PO TABS
1.0000 mg | ORAL_TABLET | Freq: Every day | ORAL | Status: DC
  Administered 2012-04-02 – 2012-04-03 (×2): 1 mg via ORAL
  Filled 2012-04-02 (×2): qty 1

## 2012-04-02 MED ORDER — LABETALOL HCL 5 MG/ML IV SOLN
INTRAVENOUS | Status: DC | PRN
  Administered 2012-04-02 (×2): 10 mg via INTRAVENOUS

## 2012-04-02 MED ORDER — MEPERIDINE HCL 25 MG/ML IJ SOLN: 6.2500 mg | INTRAMUSCULAR | Status: DC | PRN

## 2012-04-02 MED ORDER — ONDANSETRON HCL 4 MG/2ML IJ SOLN
4.0000 mg | Freq: Four times a day (QID) | INTRAMUSCULAR | Status: DC | PRN
  Administered 2012-04-02: 4 mg via INTRAVENOUS
  Filled 2012-04-02: qty 2

## 2012-04-02 MED ORDER — DOPAMINE-DEXTROSE 3.2-5 MG/ML-% IV SOLN: 3.0000 ug/kg/min | INTRAVENOUS | Status: DC

## 2012-04-02 MED ORDER — PHENYLEPHRINE HCL 10 MG/ML IJ SOLN
10.0000 mg | INTRAVENOUS | Status: DC | PRN
  Administered 2012-04-02: 15 ug/min via INTRAVENOUS

## 2012-04-02 MED ORDER — LORAZEPAM 1 MG PO TABS: 1.0000 mg | ORAL_TABLET | Freq: Four times a day (QID) | ORAL | Status: DC | PRN

## 2012-04-02 MED ORDER — DOCUSATE SODIUM 100 MG PO CAPS
100.0000 mg | ORAL_CAPSULE | Freq: Every day | ORAL | Status: DC
  Filled 2012-04-02: qty 1

## 2012-04-02 MED ORDER — ZIPRASIDONE HCL 40 MG PO CAPS
40.0000 mg | ORAL_CAPSULE | Freq: Two times a day (BID) | ORAL | Status: DC
  Administered 2012-04-02 – 2012-04-03 (×2): 40 mg via ORAL
  Filled 2012-04-02 (×5): qty 1

## 2012-04-02 MED ORDER — ACETAMINOPHEN 650 MG RE SUPP: 325.0000 mg | RECTAL | Status: DC | PRN

## 2012-04-02 MED ORDER — ACETAMINOPHEN 325 MG PO TABS
325.0000 mg | ORAL_TABLET | ORAL | Status: DC | PRN
  Administered 2012-04-03: 650 mg via ORAL
  Filled 2012-04-02: qty 2

## 2012-04-02 MED ORDER — POTASSIUM CHLORIDE CRYS ER 20 MEQ PO TBCR: 20.0000 meq | EXTENDED_RELEASE_TABLET | Freq: Once | ORAL | Status: AC | PRN

## 2012-04-02 MED ORDER — GLYCOPYRROLATE 0.2 MG/ML IJ SOLN
INTRAMUSCULAR | Status: DC | PRN
  Administered 2012-04-02: .6 mg via INTRAVENOUS

## 2012-04-02 MED ORDER — HEPARIN SODIUM (PORCINE) 1000 UNIT/ML IJ SOLN
INTRAMUSCULAR | Status: DC | PRN
  Administered 2012-04-02: 7000 [IU] via INTRAVENOUS

## 2012-04-02 MED ORDER — SODIUM CHLORIDE 0.9 % IV SOLN: INTRAVENOUS | Status: DC

## 2012-04-02 MED ORDER — SODIUM CHLORIDE 0.9 % IR SOLN
Status: DC | PRN
  Administered 2012-04-02: 09:00:00

## 2012-04-02 MED ORDER — ALUM & MAG HYDROXIDE-SIMETH 200-200-20 MG/5ML PO SUSP: 15.0000 mL | ORAL | Status: DC | PRN

## 2012-04-02 MED ORDER — TRAZODONE HCL 50 MG PO TABS
50.0000 mg | ORAL_TABLET | Freq: Every day | ORAL | Status: DC
  Filled 2012-04-02 (×2): qty 1

## 2012-04-02 MED ORDER — SERTRALINE HCL 100 MG PO TABS
200.0000 mg | ORAL_TABLET | Freq: Every morning | ORAL | Status: DC
  Administered 2012-04-03: 200 mg via ORAL
  Filled 2012-04-02: qty 2

## 2012-04-02 MED ORDER — HYDRALAZINE HCL 20 MG/ML IJ SOLN: 10.0000 mg | INTRAMUSCULAR | Status: DC | PRN

## 2012-04-02 MED ORDER — MORPHINE SULFATE 2 MG/ML IJ SOLN
2.0000 mg | INTRAMUSCULAR | Status: DC | PRN
  Administered 2012-04-02 (×3): 2 mg via INTRAVENOUS
  Filled 2012-04-02 (×2): qty 1

## 2012-04-02 MED ORDER — LIDOCAINE HCL (CARDIAC) 20 MG/ML IV SOLN
INTRAVENOUS | Status: DC | PRN
  Administered 2012-04-02: 50 mg via INTRAVENOUS

## 2012-04-02 MED ORDER — SODIUM CHLORIDE 0.9 % IV SOLN: 500.0000 mL | Freq: Once | INTRAVENOUS | Status: AC | PRN

## 2012-04-02 MED ORDER — PHENOL 1.4 % MT LIQD: 1.0000 | OROMUCOSAL | Status: DC | PRN

## 2012-04-02 MED ORDER — ASPIRIN EC 81 MG PO TBEC: 81.0000 mg | DELAYED_RELEASE_TABLET | Freq: Every day | ORAL | Status: AC

## 2012-04-02 MED ORDER — VITAMIN B-12 500 MCG PO TABS
500.0000 ug | ORAL_TABLET | Freq: Every day | ORAL | Status: DC
  Administered 2012-04-02 – 2012-04-03 (×2): 500 ug via ORAL
  Filled 2012-04-02 (×3): qty 1

## 2012-04-02 MED ORDER — PROMETHAZINE HCL 25 MG/ML IJ SOLN: 6.2500 mg | INTRAMUSCULAR | Status: DC | PRN

## 2012-04-02 MED ORDER — PANTOPRAZOLE SODIUM 40 MG PO TBEC: 40.0000 mg | DELAYED_RELEASE_TABLET | Freq: Every day | ORAL | Status: DC

## 2012-04-02 MED ORDER — ASPIRIN EC 325 MG PO TBEC
325.0000 mg | DELAYED_RELEASE_TABLET | Freq: Every day | ORAL | Status: DC
  Administered 2012-04-02 – 2012-04-03 (×2): 325 mg via ORAL
  Filled 2012-04-02 (×2): qty 1

## 2012-04-02 MED ORDER — FENTANYL CITRATE 0.05 MG/ML IJ SOLN
INTRAMUSCULAR | Status: DC | PRN
  Administered 2012-04-02 (×2): 100 ug via INTRAVENOUS
  Administered 2012-04-02: 50 ug via INTRAVENOUS

## 2012-04-02 MED ORDER — NEOSTIGMINE METHYLSULFATE 1 MG/ML IJ SOLN
INTRAMUSCULAR | Status: DC | PRN
  Administered 2012-04-02: 3 mg via INTRAVENOUS

## 2012-04-02 SURGICAL SUPPLY — 45 items
BENZOIN TINCTURE PRP APPL 2/3 (GAUZE/BANDAGES/DRESSINGS) ×2 IMPLANT
CANISTER SUCTION 2500CC (MISCELLANEOUS) ×2 IMPLANT
CATH ROBINSON RED A/P 18FR (CATHETERS) IMPLANT
CLIP LIGATING EXTRA MED SLVR (CLIP) ×2 IMPLANT
CLIP LIGATING EXTRA SM BLUE (MISCELLANEOUS) ×2 IMPLANT
CLOTH BEACON ORANGE TIMEOUT ST (SAFETY) ×2 IMPLANT
CLSR STERI-STRIP ANTIMIC 1/2X4 (GAUZE/BANDAGES/DRESSINGS) IMPLANT
COVER SURGICAL LIGHT HANDLE (MISCELLANEOUS) ×2 IMPLANT
CRADLE DONUT ADULT HEAD (MISCELLANEOUS) ×2 IMPLANT
DECANTER SPIKE VIAL GLASS SM (MISCELLANEOUS) IMPLANT
DRAIN HEMOVAC 1/8 X 5 (WOUND CARE) IMPLANT
DRAPE WARM FLUID 44X44 (DRAPE) ×2 IMPLANT
DRSG COVADERM 4X6 (GAUZE/BANDAGES/DRESSINGS) ×2 IMPLANT
ELECT REM PT RETURN 9FT ADLT (ELECTROSURGICAL) ×2
ELECTRODE REM PT RTRN 9FT ADLT (ELECTROSURGICAL) ×1 IMPLANT
EVACUATOR SILICONE 100CC (DRAIN) IMPLANT
GEL ULTRASOUND 20GR AQUASONIC (MISCELLANEOUS) IMPLANT
GLOVE BIOGEL PI IND STRL 6.5 (GLOVE) ×2 IMPLANT
GLOVE BIOGEL PI INDICATOR 6.5 (GLOVE) ×2
GLOVE SS BIOGEL STRL SZ 7.5 (GLOVE) IMPLANT
GLOVE SUPERSENSE BIOGEL SZ 7.5 (GLOVE)
GLOVE SURG SS PI 6.5 STRL IVOR (GLOVE) ×6 IMPLANT
GLOVE SURG SS PI 7.5 STRL IVOR (GLOVE) ×2 IMPLANT
GOWN STRL NON-REIN LRG LVL3 (GOWN DISPOSABLE) ×6 IMPLANT
KIT BASIN OR (CUSTOM PROCEDURE TRAY) ×2 IMPLANT
KIT ROOM TURNOVER OR (KITS) ×2 IMPLANT
KIT SUCTION CATH 14FR (SUCTIONS) ×2 IMPLANT
NEEDLE 22X1 1/2 (OR ONLY) (NEEDLE) IMPLANT
NS IRRIG 1000ML POUR BTL (IV SOLUTION) ×4 IMPLANT
PACK CAROTID (CUSTOM PROCEDURE TRAY) ×2 IMPLANT
PAD ARMBOARD 7.5X6 YLW CONV (MISCELLANEOUS) ×4 IMPLANT
PATCH HEMASHIELD 8X75 (Vascular Products) ×2 IMPLANT
SHUNT CAROTID BYPASS 10 (VASCULAR PRODUCTS) ×2 IMPLANT
SHUNT CAROTID BYPASS 12FRX15.5 (VASCULAR PRODUCTS) IMPLANT
SPECIMEN JAR SMALL (MISCELLANEOUS) ×2 IMPLANT
STRIP CLOSURE SKIN 1/2X4 (GAUZE/BANDAGES/DRESSINGS) ×2 IMPLANT
SUT ETHILON 3 0 PS 1 (SUTURE) IMPLANT
SUT PROLENE 6 0 CC (SUTURE) ×4 IMPLANT
SUT VIC AB 3-0 SH 27 (SUTURE) ×3
SUT VIC AB 3-0 SH 27X BRD (SUTURE) ×3 IMPLANT
SUT VICRYL 4-0 PS2 18IN ABS (SUTURE) ×2 IMPLANT
SYR CONTROL 10ML LL (SYRINGE) IMPLANT
TOWEL OR 17X24 6PK STRL BLUE (TOWEL DISPOSABLE) ×2 IMPLANT
TOWEL OR 17X26 10 PK STRL BLUE (TOWEL DISPOSABLE) ×2 IMPLANT
WATER STERILE IRR 1000ML POUR (IV SOLUTION) ×2 IMPLANT

## 2012-04-02 NOTE — Anesthesia Preprocedure Evaluation (Signed)
Anesthesia Evaluation    Airway Mallampati: I  Neck ROM: Full    Dental  (+) Teeth Intact   Pulmonary  breath sounds clear to auscultation        Cardiovascular Rhythm:Regular Rate:Normal     Neuro/Psych    GI/Hepatic   Endo/Other    Renal/GU      Musculoskeletal   Abdominal   Peds  Hematology   Anesthesia Other Findings   Reproductive/Obstetrics                           Anesthesia Physical Anesthesia Plan  ASA: III  Anesthesia Plan: General   Post-op Pain Management:    Induction: Intravenous  Airway Management Planned: Oral ETT  Additional Equipment:   Intra-op Plan:   Post-operative Plan: Extubation in OR  Informed Consent: I have reviewed the patients History and Physical, chart, labs and discussed the procedure including the risks, benefits and alternatives for the proposed anesthesia with the patient or authorized representative who has indicated his/her understanding and acceptance.   Dental advisory given  Plan Discussed with: CRNA and Surgeon  Anesthesia Plan Comments:         Anesthesia Quick Evaluation

## 2012-04-02 NOTE — Interval H&P Note (Signed)
History and Physical Interval Note:  04/02/2012 8:02 AM  Ashley Powell  has presented today for surgery, with the diagnosis of Left Internal Carotid Artery Stenosis and hx of CVA  The various methods of treatment have been discussed with the patient and family. After consideration of risks, benefits and other options for treatment, the patient has consented to  Procedure(s) (LRB): ENDARTERECTOMY CAROTID (Left) as a surgical intervention .  The patient's history has been reviewed, patient examined, no change in status, stable for surgery.  I have reviewed the patient's chart and labs.  Questions were answered to the patient's satisfaction.     EARLY, TODD

## 2012-04-02 NOTE — Anesthesia Postprocedure Evaluation (Signed)
  Anesthesia Post-op Note  Patient: Ashley Powell  Procedure(s) Performed: Procedure(s) (LRB): ENDARTERECTOMY CAROTID (Left)  Patient Location: PACU  Anesthesia Type: General  Level of Consciousness: awake and alert   Airway and Oxygen Therapy: Patient Spontanous Breathing  Post-op Pain: mild  Post-op Assessment: Post-op Vital signs reviewed  Post-op Vital Signs: stable  Complications: No apparent anesthesia complications

## 2012-04-02 NOTE — Preoperative (Signed)
Beta Blockers   Reason not to administer Beta Blockers:Not Applicable 

## 2012-04-02 NOTE — Transfer of Care (Signed)
Immediate Anesthesia Transfer of Care Note  Patient: Ashley Powell  Procedure(s) Performed: Procedure(s) (LRB): ENDARTERECTOMY CAROTID (Left)  Patient Location: PACU  Anesthesia Type: General  Level of Consciousness: awake, alert  and oriented  Airway & Oxygen Therapy: Patient Spontanous Breathing and Patient connected to nasal cannula oxygen  Post-op Assessment: Report given to PACU RN and Post -op Vital signs reviewed and stable  Post vital signs: Reviewed and stable  Complications: No apparent anesthesia complications

## 2012-04-02 NOTE — Telephone Encounter (Addendum)
Message copied by Shari Prows on Wed Apr 02, 2012 11:53 AM ------      Message from: Lorin Mercy K      Created: Wed Apr 02, 2012 10:55 AM      Regarding: Schedule                   ----- Message -----         From: Dara Lords, PA         Sent: 04/02/2012  10:33 AM           To: Sharee Pimple, CMA            [name],Ashley Powell, 51 y.o., 02-09-1961            S/p left CEA today by TFE.      F/u with him in 2 weeks.            Thanks!      Samantha  I scheduled an appt for the above patient for 04/15/12 at 2:15pm with Dr.Early. I left message for the patient regarding this appt and also mailed letter. awt

## 2012-04-02 NOTE — Progress Notes (Signed)
Utilization review completed.  

## 2012-04-02 NOTE — Op Note (Signed)
[facility]   Patient name: Ashley Powell MRN: [id number] DOB: [birth year] Sex: female  04/02/2012 Pre-operative Diagnosis: Symptomatic left carotid stenosis Post-operative diagnosis:  Same Surgeon:  Larina Earthly, M.D. Assistants:  Rhyne Procedure:    left carotid Endarterectomy with Dacron patch angioplasty Anesthesia:  General Blood Loss:  See anesthesia record Specimens:  Carotid Plaque to pathology  Indications for surgery:  Symptomatic left internal carotid stenosis  Procedure in detail:  The patient was taken to the operating and placed in the supine position. The neck was prepped and draped in the usual sterile fashion. An incision was made anterior to the sternocleidomastoid muscle and continued with electrocautery through the platysma muscle. The muscle was retracted posteriorly and the carotid sheath was opened. The facial vein was ligated with 2-0 silk ties and divided. The common carotid artery was encircled with an umbilical tape and Rummel tourniquet. Dissection was continued onto the carotid bifurcation. The superior thyroid artery was controlled with a 2-0 silk Potts tie. The external carotid organ was encircled with a vessel loop and the internal carotid was encircled with umbilical tape and Rummel tourniquet. The hypoglossal and vagus nerves were identified and preserved.  The patient was given systemic heparinization. After adequate circulation time, the internal,external and common carotid arteries were occluded. The common carotid was opened with an 11 blade and the arteriotomy was continued with Potts scissors onto the internal carotid artery. A 10 shunt was passed up the internal carotid artery, allowed to back bleed, and then passed down the common carotid artery. The shunt was secured with Rummel tourniquet. The endarterectomy was begun on the common carotid artery  plaque was divided proximally with Potts scissors. The endarterectomy was  continued onto the carotid bifurcation. The external carotid was endarterectomized by eversion technique and the internal carotid artery was endarterectomized in an open fashion. Remaining debris was removed from the endarterectomy plane. A Dacron patch was brought to the field and sewn as a patch angioplasty. Prior to completing the anastomosis, the shunt was removed and the usual flushing maneuvers were undertaken. The anastomosis was then completed and flow was restored first to the external and then the internal carotid artery. Excellent flow characteristics were noted with hand-held Doppler in the internal and external carotid arteries.  The patient was given protamine to reverse the heparin. Hemostasis was obtained with electrocautery. The wounds were irrigated with saline. The wound was closed by first reapproximating the sternocleidomastoid muscle over the carotid artery with interrupted 3-0 Vicryl sutures. Next, the platysma was closed with a running 3-0 Vicryl suture. The skin was closed with a 4-0 subcuticular Vicryl suture. Benzoin and Steri-Strips were applied to the incision. A sterile dressing was placed over the incision. All sponge and needle counts were correct. The patient was awakened in the operating room, neurologically intact. They were transferred to the PACU in stable condition.   Disposition:  To PACU in stable condition,neurologically intact    Larina Earthly, M.D. [facility] [place] Office: [phone] Pager:  [phone]

## 2012-04-02 NOTE — Anesthesia Procedure Notes (Addendum)
Date/Time: 04/02/2012 9:13 AM Performed by: Gwenyth Allegra   Procedure Name: Intubation Date/Time: 04/02/2012 8:42 AM Performed by: Gwenyth Allegra Pre-anesthesia Checklist: Patient identified, Timeout performed, Emergency Drugs available, Suction available and Patient being monitored Patient Re-evaluated:Patient Re-evaluated prior to inductionOxygen Delivery Method: Circle system utilized Preoxygenation: Pre-oxygenation with 100% oxygen Intubation Type: IV induction Ventilation: Mask ventilation without difficulty Laryngoscope Size: Mac and 3 Grade View: Grade II Tube type: Oral Tube size: 7.5 mm Number of attempts: 1 Airway Equipment and Method: Stylet Secured at: 21 cm Tube secured with: Tape Dental Injury: Teeth and Oropharynx as per pre-operative assessment

## 2012-04-02 NOTE — H&P (View-Only) (Signed)
     [facility] [place]   Vascular Consult Note    Patient name: Ashley Powell MRN: [id number] DOB: 11/29/1960 Sex: female   Referred by: Coryell  Reason for referral: Left carotid stenosis  HISTORY OF PRESENT ILLNESS: Patient is a very pleasant 50-year-old with a complex past history. She had an event in 2008 with unclear etiology. In her history this is described as potential panic attack versus stroke. Work up at that time is not available. She's continued to have neurologic difficulties since that time with tremor memory loss but no focal symptoms. Recent workup revealed severe left internal carotid artery stenosis and MRI of her brain reviewed left brain chronic changes of old stroke. She also underwent echocardiogram which showed normal ventricular function and normal bubble study. There was also potential for multiple sclerosis in her shoulder port she underwent a spinal tap which was nondiagnostic. She does report chronic headache and fatigue but no focal deficits  Past Medical History  Diagnosis Date  . Depression   . COPD (chronic obstructive pulmonary disease) 2010 ?  . GERD (gastroesophageal reflux disease)   . Bipolar 1 disorder   . Anxiety   . Panic attacks   . DU (duodenal ulcer)     age 12  . Hypercholesteremia   . Malignant melanoma 1998    Stage III-left shoulder  . Stroke   . Peripheral vascular disease     Past Surgical History  Procedure Date  . Tubal ligation 1984  . Cholecystectomy 2001  . Cataract extraction, bilateral 2012  . Melanoma excision 1998    [facility]  . Lymph node dissection 1998    History   Social History  . Marital Status: Married    Spouse Name: N/A    Number of Children: 1  . Years of Education: N/A   Occupational History  . disabled-police dept    Social History Main Topics  . Smoking status: Current Everyday Smoker -- 0.5 packs/day for 22 years    Types: Cigarettes  . Smokeless tobacco:  Never Used   Comment: pt states that she likes to smoke  . Alcohol Use: No  . Drug Use: No  . Sexually Active: Not on file   Other Topics Concern  . Not on file   Social History Narrative  . No narrative on file    Family History  Problem Relation Age of Onset  . Depression Mother   . Hyperlipidemia Mother   . Hypertension Father   . Hyperlipidemia Father   . Heart disease Father     before age 60  . Diabetes Father   . Heart attack Father   . Hypertension Brother   . Heart disease Brother   . Hyperlipidemia Brother   . Depression Brother   . Heart attack Brother     Allergies  Allergen Reactions  . Latex Other (See Comments)    Reaction: cause skin to peel off     Prior to Admission medications   Medication Sig Start Date End Date Taking? Authorizing Provider  Cholecalciferol (VITAMIN D PO) Take by mouth once a week.   Yes Historical Provider, MD  Cyanocobalamin (VITAMIN B-12 CR PO) Take by mouth daily.   Yes Historical Provider, MD  dicyclomine (BENTYL) 10 MG capsule Take 1 capsule (10 mg total) by mouth 4 (four) times daily. 1 po qac and hs 01/01/12 12/31/12 Yes Sandi L Fields, MD  LORazepam (ATIVAN) 1 MG tablet Take 1 mg   by mouth 4 (four) times daily as needed. For anxiety   Yes Historical Provider, MD  omeprazole (PRILOSEC) 20 MG capsule 1 po bid 30 minutes before meals for 3 mos then once daily FOREVER 01/01/12 12/31/12 Yes Sandi L Fields, MD  propranolol (INDERAL) 10 MG tablet Take 10 mg by mouth 3 (three) times daily.   Yes Historical Provider, MD  sertraline (ZOLOFT) 100 MG tablet Take 200 mg by mouth every morning.    Yes Historical Provider, MD  traZODone (DESYREL) 100 MG tablet Take 100 mg by mouth at bedtime.    Yes Historical Provider, MD  ziprasidone (GEODON) 40 MG capsule Take 40 mg by mouth 2 (two) times daily with a meal.   Yes Historical Provider, MD  divalproex (DEPAKOTE) 500 MG DR tablet Take 500-1,000 mg by mouth 2 (two) times daily. Take 1 tablet (500  mg) in the morning and 2 tablets (1000 mg) at bedtime    Historical Provider, MD  hydrOXYzine (ATARAX/VISTARIL) 50 MG tablet Take 50 mg by mouth at bedtime.    Historical Provider, MD  methylphenidate (RITALIN) 20 MG tablet Take 10 mg by mouth 3 (three) times daily.    Historical Provider, MD  Probiotic Product (ALIGN) 4 MG CAPS Take 1 capsule by mouth every morning.    Historical Provider, MD  rosuvastatin (CRESTOR) 10 MG tablet Take 1 tablet (10 mg total) by mouth at bedtime. 12/12/11 12/11/12  Kawanta F , MD     REVIEW OF SYSTEMS: Cardiovascular: No chest pain, chest pressure, palpitations, orthopnea, or dyspnea on exertion. No claudication or rest pain,  No history of DVT or phlebitis. Pulmonary: No productive cough, asthma or wheezing. Neurologic: No weakness, paresthesias, , or amaurosis.  Does report occasional slurred speech when she is tired and has reported dizziness Hematologic: No bleeding problems or clotting disorders. Musculoskeletal: No joint pain or joint swelling. Gastrointestinal: No blood in stool or hematemesis Genitourinary: No dysuria or hematuria. Psychiatric:: History of major depression Integumentary: No rashes or ulcers. Constitutional: No fever or chills.  PHYSICAL EXAMINATION:  Filed Vitals:   03/25/12 1404  BP: 145/89  Pulse: 78  Temp:     General: The patient appears their stated age. Pulmonary: There is a good air exchange bilaterally without wheezing or rales. Abdomen: Soft and non-tender with normal pitch bowel sounds. Musculoskeletal: There are no major deformities.  There is no significant extremity pain. Neurologic: No focal weakness or paresthesias are detected, Skin: There are no ulcer or rashes noted. Psychiatric: The patient has normal affect. Cardiovascular: There is a regular rate and rhythm without significant murmur appreciated. Carotid arteries without bruits bilaterally Diagnostic Studies: Carotid duplex in our office reveal  upper end of 60-79% stenosis in the left internal carotid artery areas in the normal level of the bifurcation with normal internal carotid artery distal to this.  Outside Studies/Documentation Historical records were reviewed.  They showed reviewed extensive medical records from her primary care physician and neurologist Chung workup as described above  Medication Changes: None  Assessment:  Left internal carotid artery stenosis in the 75-80% range by an outlying study and by her study here today  Plan: Had extensive discussion with the patient and her family present. It is clear that she has significant left internal carotid R. stenosis. It is unclear as to whether or not this has anything to do with her neurologic event in the year 2008. 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 I discussed   the option of continued carotid duplex surveillance versus carotid endarterectomy for treatment. I feel that her young age and with a 75-80% stenosis and probable embolic event seen on her right brain MRI and I would recommend endarterectomy for reduction of stroke risk. She has not comfortable with surveillance only. She wish to proceed with surgery we'll schedule this at her convenience and one week. I described the procedure including a 1-2% risk of stroke with surgery in the unlikely risk of cranial nerve injury. She understands this is typically a one night hospitalization.  Marrietta Thunder 8/6/20134:34 PM   

## 2012-04-02 NOTE — Discharge Summary (Signed)
[facility] Discharge Summary  Ashley Powell 1961/02/13 51 y.o. female  [id number]  Admission Date: 04/02/2012  Discharge Date: 04/03/2012  Physician: Ashley Earthly, MD  Admission Diagnosis: Left Internal Carotid Artery Stenosis and hx of CVA   HPI:   This is a 51 y.o. female with a complex past history. She had an event in 2008 with unclear etiology. In her history this is described as potential panic attack versus stroke. Work up at that time is not available. She's continued to have neurologic difficulties since that time with tremor memory loss but no focal symptoms. Recent workup revealed severe left internal carotid artery stenosis and MRI of her brain reviewed left brain chronic changes of old stroke. She also underwent echocardiogram which showed normal ventricular function and normal bubble study. There was also potential for multiple sclerosis in her shoulder port she underwent a spinal tap which was nondiagnostic. She does report chronic headache and fatigue but no focal deficits  Hospital Course:  The patient was admitted to the hospital and taken to the operating room on 04/02/2012 and underwent left carotid endarterectomy.  The pt tolerated the procedure well and was transported to the PACU in excellent condition.   By POD 1, the pt neuro status normal.  The remainder of the hospital course consisted of increasing mobilization and increasing intake of solids without difficulty.     Discharge Instructions:   The patient is discharged to home with extensive instructions on wound care and progressive ambulation.  They are instructed not to drive or perform any heavy lifting until returning to see the physician in his office.  Discharge Orders    Future Appointments: Provider: Department: Dept Phone: Center:   05/01/2012 10:00 AM Ashley Retort, NP [facility] [phone] [facility]   06/10/2012 1:00 PM Ashley Scarlet, MD [facility]  [phone] [facility]     Future Orders Please Complete By Expires   Resume previous diet      Driving Restrictions      Comments:   No driving for 2 weeks   Lifting restrictions      Comments:   No lifting for 6 weeks   Call MD for:  temperature >100.5      Call MD for:  redness, tenderness, or signs of infection (pain, swelling, bleeding, redness, odor or green/yellow discharge around incision site)      Call MD for:  severe or increased pain, loss or decreased feeling  in affected limb(s)      Discharge wound care:      Comments:   Shower daily with soap and water starting 04/04/12   CAROTID Sugery: Call MD for difficulty swallowing or speaking; weakness in arms or legs that is a new symtom; severe headache.  If you have increased swelling in the neck and/or  are having difficulty breathing, CALL 911         Discharge Diagnosis:  Left Internal Carotid Artery Stenosis and hx of CVA  Secondary Diagnosis: Patient Active Problem List  Diagnosis  . COPD (chronic obstructive pulmonary disease)  . Tobacco user  . Bipolar 1 disorder  . Panic attacks  . Personal history of malignant melanoma  . Long term use of drug  . Gynecological examination  . Diarrhea  . History of skin cancer  . Nausea  . GERD (gastroesophageal reflux disease)  . Bite by animal  . Chronic abdominal pain  . Abnormal involuntary movements  . Occlusion and stenosis of  carotid artery without mention of cerebral infarction   Past Medical History  Diagnosis Date  . Depression   . COPD (chronic obstructive pulmonary disease) 2010 ?  Marland Kitchen GERD (gastroesophageal reflux disease)   . Bipolar 1 disorder   . Anxiety   . Panic attacks   . DU (duodenal ulcer)     age 58  . Hypercholesteremia   . Malignant melanoma 1998    Stage III-left shoulder  . Stroke   . Peripheral vascular disease   . Complication of anesthesia     difficulty waking up  . Family history of anesthesia complication     mother N/V unless  premedicated    Ashley, Powell  Home Medication Instructions [id number]   Printed on:04/02/12 1036  Medication Information                    sertraline (ZOLOFT) 100 MG tablet Take 200 mg by mouth every morning.            LORazepam (ATIVAN) 1 MG tablet Take 1 mg by mouth 4 (four) times daily as needed. For anxiety           ziprasidone (GEODON) 40 MG capsule Take 40 mg by mouth 2 (two) times daily with a meal.           propranolol (INDERAL) 10 MG tablet Take 10 mg by mouth 3 (three) times daily.           Vitamin D, Ergocalciferol, (DRISDOL) 50000 UNITS CAPS Take 50,000 Units by mouth every 7 (seven) days. On Mondays.           vitamin B-12 (CYANOCOBALAMIN) 500 MCG tablet Take 500 mcg by mouth daily.           omeprazole (PRILOSEC) 20 MG capsule Take 20 mg by mouth daily.           traZODone (DESYREL) 50 MG tablet Take 50 mg by mouth at bedtime.           methylphenidate (CONCERTA) 54 MG CR tablet Take 54 mg by mouth every morning.           folic acid (FOLVITE) 1 MG tablet Take 1 mg by mouth daily.           aspirin EC 81 MG tablet Take 1 tablet (81 mg total) by mouth daily.           oxyCODONE (ROXICODONE) 5 MG immediate release tablet Take 1 tablet (5 mg total) by mouth every 6 (six) hours as needed for pain. #30 NR             Disposition: Stable  Patient's condition: is Excellent  Follow up: 1. Dr.  Arbie Powell in 2 weeks.   Ashley Massed, PA-C [facility] [phone] 04/02/2012  10:35 AM

## 2012-04-03 ENCOUNTER — Encounter (HOSPITAL_COMMUNITY): Payer: Self-pay | Admitting: Vascular Surgery

## 2012-04-03 LAB — CBC
MCH: 29.5 pg (ref 26.0–34.0)
MCV: 88.2 fL (ref 78.0–100.0)
Platelets: 159 10*3/uL (ref 150–400)
RBC: 4.17 MIL/uL (ref 3.87–5.11)
RDW: 14 % (ref 11.5–15.5)
WBC: 10.7 10*3/uL — ABNORMAL HIGH (ref 4.0–10.5)

## 2012-04-03 LAB — BASIC METABOLIC PANEL
Calcium: 8.9 mg/dL (ref 8.4–10.5)
Creatinine, Ser: 0.61 mg/dL (ref 0.50–1.10)
GFR calc Af Amer: >90 mL/min (ref >90)
Sodium: 137 mEq/L (ref 135–145)

## 2012-04-03 NOTE — Progress Notes (Signed)
NSL intact to rt hand, but site slightly swollen, pink  and painful.  NSL dc'd intact.   Pt tolerated without event.  Continue to watch.  Lianne Cure PA notified and IV antibiotic to be dc'd

## 2012-04-03 NOTE — Progress Notes (Addendum)
VASCULAR AND VEIN SURGERY POST - OP [facility] PROGRESS NOTE  Date of Surgery: 04/02/2012 Surgeon: Moishe Spice): Larina Earthly, MD 1 Day Post-Op left Carotid Endarterectomy .  HPI: Ashley Powell is a 51 y.o. female who is 1 Day Post-Op left Carotid Endarterectomy . Patient is doing well. Pre-operative symptoms are Improved Patient denies headache; Patient denies difficulty swallowing; denies weakness in upper or lower extremities; Pt. denies other symptoms of stroke or TIA.  IMAGING: No results found.  Significant Diagnostic Studies: CBC Lab Results  Component Value Date   WBC 10.7* 04/03/2012   HGB 12.3 04/03/2012   HCT 36.8 04/03/2012   MCV 88.2 04/03/2012   PLT 159 04/03/2012    BMET    Component Value Date/Time   NA 137 04/03/2012 0430   K 4.1 04/03/2012 0430   CL 103 04/03/2012 0430   CO2 26 04/03/2012 0430   GLUCOSE 120* 04/03/2012 0430   BUN 6 04/03/2012 0430   CREATININE 0.61 04/03/2012 0430   CREATININE 0.67 12/20/2011 1622   CALCIUM 8.9 04/03/2012 0430   GFRNONAA >90 04/03/2012 0430   GFRAA >90 04/03/2012 0430    COAG Lab Results  Component Value Date   INR 1.09 03/28/2012   No results found for this basename: PTT      Intake/Output Summary (Last 24 hours) at 04/03/12 0743 Last data filed at 04/03/12 9604  Gross per 24 hour  Intake   1510 ml  Output   2000 ml  Net   -490 ml    Physical Exam:  BP Readings from Last 3 Encounters:  04/03/12 121/63  04/03/12 121/63  03/28/12 124/64   Temp Readings from Last 3 Encounters:  04/03/12 99.6 F (37.6 C) Oral  04/03/12 99.6 F (37.6 C) Oral  03/28/12 97.9 F (36.6 C)    SpO2 Readings from Last 3 Encounters:  04/03/12 90%  04/03/12 90%  03/28/12 95%   Pulse Readings from Last 3 Encounters:  04/03/12 81  04/03/12 81  03/28/12 68    Pt is A&O x 3  Speech is fluent left Neck Wound is healing well Patient with Negative tongue deviation and Negative facial droop Pt has good and equal strength in all  extremities  Assessment: Ashley Powell is a 51 y.o. female is S/P Left Carotid endarterectomy Pt is voiding, ambulating and taking po well   Plan: Discharge to: Home Follow-up in 2 weeks   Clinton Gallant [facility] [phone] 04/03/2012 7:43 AM   I have examined the patient, reviewed and agree with above.  EARLY, TODD, MD 04/03/2012 8:00 AM

## 2012-04-03 NOTE — Progress Notes (Signed)
Discharge instructions given to patient and husband. Both nodded head for understanding. Questioned answered that were asked. Discharged to home.

## 2012-04-14 ENCOUNTER — Encounter: Payer: Self-pay | Admitting: Neurosurgery

## 2012-04-15 ENCOUNTER — Encounter: Payer: Self-pay | Admitting: Vascular Surgery

## 2012-04-15 ENCOUNTER — Ambulatory Visit (INDEPENDENT_AMBULATORY_CARE_PROVIDER_SITE_OTHER): Payer: BC Managed Care – PPO | Admitting: Vascular Surgery

## 2012-04-15 VITALS — BP 137/88 | HR 86 | Temp 98.4°F | Ht 64.96 in | Wt 174.0 lb

## 2012-04-15 DIAGNOSIS — I6529 Occlusion and stenosis of unspecified carotid artery: Secondary | ICD-10-CM

## 2012-04-15 DIAGNOSIS — Z48812 Encounter for surgical aftercare following surgery on the circulatory system: Secondary | ICD-10-CM

## 2012-04-15 NOTE — Progress Notes (Signed)
Presents today for followup of her recent left carotid endarterectomy for symptomatic carotid disease. She did well in the hospital was discharged home on postoperative day #1. She's had no neurologic deficits since discharge. She did have an episode of tremors in both legs which resolved after taking muscle relaxants. She has had no aphasia and no right sided weakness.  Physical exam reveals a well-developed white female no acute distress. Her left neck incision is well healed. She has no bruits bilaterally. She is grossly intact neurologically  Impression and plan: Normal postop recovery following left carotid endarterectomy. The patient will resume full activities that limitation. We will see her again in 6 months with repeat carotid duplex. She'll notify should she develop any wound issues or neurologic deficits

## 2012-04-15 NOTE — Addendum Note (Signed)
Addended by: Sharee Pimple on: 04/15/2012 02:31 PM   Modules accepted: Orders

## 2012-04-30 ENCOUNTER — Encounter: Payer: Self-pay | Admitting: Gastroenterology

## 2012-05-01 ENCOUNTER — Encounter: Payer: Self-pay | Admitting: Gastroenterology

## 2012-05-01 ENCOUNTER — Ambulatory Visit (INDEPENDENT_AMBULATORY_CARE_PROVIDER_SITE_OTHER): Payer: BC Managed Care – PPO | Admitting: Gastroenterology

## 2012-05-01 VITALS — BP 107/71 | HR 87 | Temp 98.1°F | Ht 65.0 in | Wt 173.8 lb

## 2012-05-01 DIAGNOSIS — R110 Nausea: Secondary | ICD-10-CM

## 2012-05-01 DIAGNOSIS — K219 Gastro-esophageal reflux disease without esophagitis: Secondary | ICD-10-CM

## 2012-05-01 DIAGNOSIS — R197 Diarrhea, unspecified: Secondary | ICD-10-CM

## 2012-05-01 MED ORDER — ONDANSETRON 4 MG PO TBDP: 4.0000 mg | ORAL_TABLET | Freq: Three times a day (TID) | ORAL | Status: AC | PRN

## 2012-05-01 MED ORDER — PANTOPRAZOLE SODIUM 40 MG PO TBEC: 40.0000 mg | DELAYED_RELEASE_TABLET | Freq: Two times a day (BID) | ORAL | Status: DC

## 2012-05-01 NOTE — Progress Notes (Signed)
Referring Provider: Salley Scarlet, MD Primary Care Physician:  Milinda Antis, MD Primary Gastroenterologist: Dr. Darrick Penna   Chief Complaint  Patient presents with  . Nausea  . Gastrophageal Reflux    HPI:   Here for f/u, chronic diarrhea. TCS, EGD done in interim from last visit. Next TCS in 10 years. EGD with mild gastritis. Stays nauseated. Can't get rid of chills, no fever. GERD back, coughing, gagging. Was on three Prilosec a day. Did ok with that. Would eat and get nauseated. + burping. Now nauseated all the time. +epigastric pain. FEels like it goes up to her chest.  Diarrhea resolved. Never been regular bowel habits. Was on Bentyl, quit taking because didn't have BM for 2 weeks. Has weaned off of it now. Once a month will take a laxative.   Ibuprofen daily.   Past Medical History  Diagnosis Date  . Depression   . COPD (chronic obstructive pulmonary disease) 2010 ?  Marland Kitchen GERD (gastroesophageal reflux disease)   . Bipolar 1 disorder   . Anxiety   . Panic attacks   . DU (duodenal ulcer)     age 34  . Hypercholesteremia   . Malignant melanoma 1998    Stage III-left shoulder  . Stroke   . Peripheral vascular disease   . Complication of anesthesia     difficulty waking up  . Family history of anesthesia complication     mother N/V unless premedicated    Past Surgical History  Procedure Date  . Tubal ligation 1984  . Cholecystectomy 2001  . Cataract extraction, bilateral 2012  . Melanoma excision 1998    [facility]  . Lymph node dissection 1998  . Tonsillectomy   . Transthoracic echocardiogram 02/2012  . Endarterectomy 04/02/2012    Procedure: ENDARTERECTOMY CAROTID;  Surgeon: Larina Earthly, MD;  Location: [facility] OR;  Service: Vascular;  Laterality: Left;  left carotid endarterectomy with patch angioplasty  . Carotid endarterectomy   . Esophagogastroduodenoscopy 01/01/12    patent stricture in the distal esophagus.small hiatal hernia/abd pain due to IBS, GERD, or gastritis   . Colonoscopy 01/01/12    multiple polyps, internal hemorrhoids, hyperplastic, repeat in 2023    Current Outpatient Prescriptions  Medication Sig Dispense Refill  . aspirin EC 81 MG tablet Take 1 tablet (81 mg total) by mouth daily.  150 tablet  2  . folic acid (FOLVITE) 1 MG tablet Take 1 mg by mouth daily.      Marland Kitchen LORazepam (ATIVAN) 1 MG tablet Take 1 mg by mouth 4 (four) times daily as needed. For anxiety      . methylphenidate (CONCERTA) 54 MG CR tablet Take 54 mg by mouth every morning.      Marland Kitchen omeprazole (PRILOSEC) 20 MG capsule Take 20 mg by mouth daily.      . propranolol (INDERAL) 10 MG tablet Take 10 mg by mouth 3 (three) times daily.      . sertraline (ZOLOFT) 100 MG tablet Take 150 mg by mouth every morning.       . traZODone (DESYREL) 50 MG tablet Take 50 mg by mouth at bedtime.      . vitamin B-12 (CYANOCOBALAMIN) 500 MCG tablet Take 500 mcg by mouth daily.      . Vitamin D, Ergocalciferol, (DRISDOL) 50000 UNITS CAPS Take 50,000 Units by mouth every 7 (seven) days. On Mondays.      . ondansetron (ZOFRAN ODT) 4 MG disintegrating tablet Take 1 tablet (4 mg total) by mouth every 8 (  eight) hours as needed for nausea.  30 tablet  1  . pantoprazole (PROTONIX) 40 MG tablet Take 1 tablet (40 mg total) by mouth 2 (two) times daily.  60 tablet  3  . ziprasidone (GEODON) 40 MG capsule Take 40 mg by mouth 2 (two) times daily with a meal.        Allergies as of 05/01/2012 - Review Complete 05/01/2012  Allergen Reaction Noted  . Latex Other (See Comments) 12/24/2011    Family History  Problem Relation Age of Onset  . Depression Mother   . Hyperlipidemia Mother   . Hypertension Father   . Hyperlipidemia Father   . Heart disease Father     before age 65  . Diabetes Father   . Heart attack Father   . Hypertension Brother   . Heart disease Brother   . Hyperlipidemia Brother   . Depression Brother   . Heart attack Brother     History   Social History  . Marital Status: Married     Spouse Name: N/A    Number of Children: 1  . Years of Education: N/A   Occupational History  . disabled-police dept    Social History Main Topics  . Smoking status: Current Every Day Smoker -- 0.3 packs/day for 22 years    Types: Cigarettes  . Smokeless tobacco: Never Used   Comment: pt states that she likes to smoke  . Alcohol Use: No  . Drug Use: No  . Sexually Active: None   Other Topics Concern  . None   Social History Narrative  . None    Review of Systems: Gen: Denies fever, chills, anorexia. Denies fatigue, weakness, weight loss.  CV: Denies chest pain, palpitations, syncope, peripheral edema, and claudication. Resp: Denies dyspnea at rest, cough, wheezing, coughing up blood, and pleurisy. GI: Denies vomiting blood, jaundice, and fecal incontinence.   Denies dysphagia or odynophagia. Derm: Denies rash, itching, dry skin Psych: Denies depression, anxiety, memory loss, confusion. No homicidal or suicidal ideation.  Heme: Denies bruising, bleeding, and enlarged lymph nodes.  Physical Exam: BP 107/71  Pulse 87  Temp 98.1 F (36.7 C) (Temporal)  Ht 5\' 5"  (1.651 m)  Wt 173 lb 12.8 oz (78.835 kg)  BMI 28.92 kg/m2 General:   Alert and oriented. No distress noted. Pleasant and cooperative.  Head:  Normocephalic and atraumatic. Eyes:  Conjuctiva clear without scleral icterus. Mouth:  Oral mucosa pink and moist. Good dentition. No lesions. Heart:  S1, S2 present without murmurs, rubs, or gallops. Regular rate and rhythm. Abdomen:  +BS, soft, non-tender and non-distended. No rebound or guarding. No HSM or masses noted. Msk:  Symmetrical without gross deformities. Normal posture. Extremities:  Without edema. Neurologic:  Alert and  oriented x4;  grossly normal neurologically. Skin:  Intact without significant lesions or rashes. Psych:  Alert and cooperative. Normal mood and affect.

## 2012-05-01 NOTE — Assessment & Plan Note (Signed)
Resolved

## 2012-05-01 NOTE — Assessment & Plan Note (Signed)
Change from Prilosec to Protonix. Increase to BID. F/U after GES>

## 2012-05-01 NOTE — Patient Instructions (Addendum)
I have set you up for a test to determine how well your stomach empties.  In the meantime, a prescription for nausea medication (Zofran) has been sent to your pharmacy.  Stop Prilosec. Start taking Protonix twice a day. This has been sent to your pharmacy.  We will see you back after the gastric emptying study.

## 2012-05-01 NOTE — Assessment & Plan Note (Signed)
Constant. Severe. ?gastroparesis. Proceed with GES. Change PPI to Protonix, BID. F/U after GES

## 2012-05-02 ENCOUNTER — Encounter (HOSPITAL_COMMUNITY): Payer: Self-pay

## 2012-05-02 ENCOUNTER — Encounter (HOSPITAL_COMMUNITY)
Admission: RE | Admit: 2012-05-02 | Discharge: 2012-05-02 | Disposition: A | Discharge status: Home / Self Care | Payer: BC Managed Care – PPO | Source: Ambulatory Visit | Attending: Gastroenterology | Admitting: Gastroenterology

## 2012-05-02 DIAGNOSIS — R110 Nausea: Secondary | ICD-10-CM | POA: Insufficient documentation

## 2012-05-02 MED ORDER — TECHNETIUM TC 99M SULFUR COLLOID
2.0000 | Freq: Once | INTRAVENOUS | Status: AC | PRN
  Administered 2012-05-02: 1.9 via ORAL

## 2012-05-02 NOTE — Progress Notes (Signed)
Faxed to PCP

## 2012-05-05 NOTE — Progress Notes (Signed)
Quick Note:  Called and informed pt. She is doing very well except some occasional nausea. But then states that she has not have a BM in % days. No pain, just the occasional nausea. Please advise! ______

## 2012-05-05 NOTE — Progress Notes (Signed)
Quick Note:  GES is normal. I increased PPI to BID How is pt?  Needs to return to clinic if not better. If she is better, return in 1 month. ______

## 2012-05-07 ENCOUNTER — Telehealth: Payer: Self-pay | Admitting: Gastroenterology

## 2012-05-07 NOTE — Telephone Encounter (Signed)
Done

## 2012-05-07 NOTE — Progress Notes (Signed)
Quick Note:  May take Miralax daily as needed for BM.  Have her continue PPI BID.  Return to clinic in 4-6 weeks. Glad she is doing well and nausea seems to be better. ______

## 2012-05-07 NOTE — Progress Notes (Signed)
Quick Note:  Called and informed pt. ______ 

## 2012-05-07 NOTE — Progress Notes (Signed)
Quick Note:  LM for pt to return call. ______ 

## 2012-05-07 NOTE — Telephone Encounter (Signed)
Pt was returning DS call and I told her that I would have her call patient back at [phone]

## 2012-05-08 ENCOUNTER — Encounter: Payer: Self-pay | Admitting: Gastroenterology

## 2012-06-03 ENCOUNTER — Telehealth: Payer: Self-pay | Admitting: Family Medicine

## 2012-06-03 DIAGNOSIS — F410 Panic disorder [episodic paroxysmal anxiety] without agoraphobia: Secondary | ICD-10-CM

## 2012-06-03 DIAGNOSIS — F319 Bipolar disorder, unspecified: Secondary | ICD-10-CM

## 2012-06-04 NOTE — Telephone Encounter (Signed)
Have her call us with name of psychiatrist if referral is needed, she can also call [facility] and schedule her appt as last result

## 2012-06-05 ENCOUNTER — Telehealth: Payer: Self-pay | Admitting: Gastroenterology

## 2012-06-05 ENCOUNTER — Ambulatory Visit: Payer: BC Managed Care – PPO | Admitting: Gastroenterology

## 2012-06-05 NOTE — Progress Notes (Signed)
EGD MAY 2013 GASTRITIS, NL DUO Bx TCS MAY 2013 NL COLON BX, HYPERPLASTIC POLYPS SEP 2013 NL GES  REVIEWED.

## 2012-06-05 NOTE — Telephone Encounter (Signed)
Pt was a no show

## 2012-06-06 NOTE — Telephone Encounter (Signed)
Patient stated that she did call insurance and they gave her the names of two psychiatrist in her network.  Dr. Anibal Henderson and Dr. Leta Baptist.  She has an appointment on 10/22

## 2012-06-06 NOTE — Addendum Note (Signed)
Addended by: Milinda Antis F on: 06/06/2012 06:09 PM   Modules accepted: Orders

## 2012-06-10 ENCOUNTER — Encounter: Payer: Self-pay | Admitting: Family Medicine

## 2012-06-10 ENCOUNTER — Ambulatory Visit (INDEPENDENT_AMBULATORY_CARE_PROVIDER_SITE_OTHER): Payer: BC Managed Care – PPO | Admitting: Family Medicine

## 2012-06-10 VITALS — BP 116/68 | HR 100 | Resp 18 | Ht 65.5 in | Wt 178.0 lb

## 2012-06-10 DIAGNOSIS — Z720 Tobacco use: Secondary | ICD-10-CM

## 2012-06-10 DIAGNOSIS — J449 Chronic obstructive pulmonary disease, unspecified: Secondary | ICD-10-CM

## 2012-06-10 DIAGNOSIS — F319 Bipolar disorder, unspecified: Secondary | ICD-10-CM

## 2012-06-10 DIAGNOSIS — E785 Hyperlipidemia, unspecified: Secondary | ICD-10-CM

## 2012-06-10 DIAGNOSIS — F17200 Nicotine dependence, unspecified, uncomplicated: Secondary | ICD-10-CM

## 2012-06-10 NOTE — Patient Instructions (Addendum)
Get fasting labs done Call us if you do not hear about psychiatry appt  Continue current medications Remember one day at a time F/U 3 months

## 2012-06-10 NOTE — Progress Notes (Signed)
  Subjective:    Patient ID: Ashley Powell, female    DOB: January 25, 1961, 51 y.o.   MRN: [id number]  HPI Patient presents to follow chronic medical problems. She's doing well has no specific concerns. Stasis but that she's felt in 5 years. She status post left carotid endarterectomy by vascular. She is very elevated cholesterol however was unable to tolerate 3 different statin drugs. She has been trying to watch her diet. She still following with neurology. After being taken off her Geodon for her bipolar and manic episodes some of her symptoms actually cleared up including her tremor and headache. She is however now and surgery for new psychiatrist and feels that she may be becoming manic he can been off of her medication. She is still sleeping but feels that she needs a very busy.   Review of Systems  GEN- denies fatigue, fever, weight loss,weakness, recent illness HEENT- denies eye drainage, change in vision, nasal discharge, CVS- denies chest pain, palpitations RESP- denies SOB, cough, wheeze ABD- denies N/V, change in stools, abd pain GU- denies dysuria, hematuria, dribbling, incontinence MSK- denies joint pain, muscle aches, injury Neuro- denies headache, dizziness, syncope, seizure activity      Objective:   Physical Exam GEN- NAD, alert and oriented x3 HEENT- PERRL, EOMI, non injected sclera, pink conjunctiva, MMM, oropharynx clear CVS- RRR, no murmur RESP-CTAB ABD-NABS,soft,nt,nd EXT- No edema Pulses- Radial, DP- 2+ Psych- mildly anxious, not depressed appearing, smiling,       Assessment & Plan:

## 2012-06-11 DIAGNOSIS — E785 Hyperlipidemia, unspecified: Secondary | ICD-10-CM | POA: Insufficient documentation

## 2012-06-11 NOTE — Assessment & Plan Note (Signed)
Stable, no meds

## 2012-06-11 NOTE — Assessment & Plan Note (Signed)
She feels herself trending toward a manic episode, We have placed STAT psychiatry referral she is current safe and doing well Multiple medication intolerances and side effects, I do not feel comfortable starting any new meds

## 2012-06-11 NOTE — Assessment & Plan Note (Signed)
She is working on cessation. 

## 2012-06-11 NOTE — Assessment & Plan Note (Signed)
Unable to tolerate statins, neurologist changed statin twice, advised fish oil twice a day

## 2012-07-10 ENCOUNTER — Ambulatory Visit (HOSPITAL_COMMUNITY)
Admission: RE | Admit: 2012-07-10 | Discharge: 2012-07-10 | Disposition: A | Discharge status: Home / Self Care | Payer: BC Managed Care – PPO | Source: Ambulatory Visit | Attending: Neurology | Admitting: Neurology

## 2012-07-10 ENCOUNTER — Other Ambulatory Visit: Payer: Self-pay | Admitting: Neurology

## 2012-07-10 DIAGNOSIS — M545 Low back pain, unspecified: Secondary | ICD-10-CM | POA: Insufficient documentation

## 2012-07-10 DIAGNOSIS — M47817 Spondylosis without myelopathy or radiculopathy, lumbosacral region: Secondary | ICD-10-CM | POA: Insufficient documentation

## 2012-07-10 DIAGNOSIS — M5137 Other intervertebral disc degeneration, lumbosacral region: Secondary | ICD-10-CM | POA: Insufficient documentation

## 2012-07-10 DIAGNOSIS — M51379 Other intervertebral disc degeneration, lumbosacral region without mention of lumbar back pain or lower extremity pain: Secondary | ICD-10-CM | POA: Insufficient documentation

## 2012-07-22 LAB — LIPID PANEL
Cholesterol: 324 mg/dL — ABNORMAL HIGH (ref 0–200)
Total CHOL/HDL Ratio: 9 Ratio

## 2012-07-29 MED ORDER — EZETIMIBE 10 MG PO TABS: 10.0000 mg | ORAL_TABLET | Freq: Every day | ORAL | Status: DC

## 2012-07-29 NOTE — Addendum Note (Signed)
Addended by: Milinda Antis F on: 07/29/2012 12:21 PM   Modules accepted: Orders

## 2012-08-04 ENCOUNTER — Encounter (HOSPITAL_COMMUNITY): Payer: Self-pay | Admitting: Psychiatry

## 2012-08-04 ENCOUNTER — Ambulatory Visit (INDEPENDENT_AMBULATORY_CARE_PROVIDER_SITE_OTHER): Payer: BC Managed Care – PPO | Admitting: Psychiatry

## 2012-08-04 VITALS — BP 152/84 | HR 92 | Ht 65.0 in | Wt 180.0 lb

## 2012-08-04 DIAGNOSIS — F411 Generalized anxiety disorder: Secondary | ICD-10-CM

## 2012-08-04 DIAGNOSIS — F429 Obsessive-compulsive disorder, unspecified: Secondary | ICD-10-CM | POA: Insufficient documentation

## 2012-08-04 DIAGNOSIS — F410 Panic disorder [episodic paroxysmal anxiety] without agoraphobia: Secondary | ICD-10-CM

## 2012-08-04 DIAGNOSIS — F3130 Bipolar disorder, current episode depressed, mild or moderate severity, unspecified: Secondary | ICD-10-CM

## 2012-08-04 DIAGNOSIS — F319 Bipolar disorder, unspecified: Secondary | ICD-10-CM

## 2012-08-04 DIAGNOSIS — F5105 Insomnia due to other mental disorder: Secondary | ICD-10-CM | POA: Insufficient documentation

## 2012-08-04 DIAGNOSIS — F489 Nonpsychotic mental disorder, unspecified: Secondary | ICD-10-CM

## 2012-08-04 MED ORDER — SERTRALINE HCL 100 MG PO TABS: 200.0000 mg | ORAL_TABLET | Freq: Every morning | ORAL | Status: DC

## 2012-08-04 MED ORDER — GABAPENTIN 100 MG PO CAPS: 100.0000 mg | ORAL_CAPSULE | Freq: Four times a day (QID) | ORAL | Status: DC

## 2012-08-04 MED ORDER — TRAZODONE HCL 50 MG PO TABS: 50.0000 mg | ORAL_TABLET | Freq: Every day | ORAL | Status: DC

## 2012-08-04 MED ORDER — CARBAMAZEPINE ER 200 MG PO TB12: 200.0000 mg | ORAL_TABLET | Freq: Two times a day (BID) | ORAL | Status: DC

## 2012-08-04 MED ORDER — PROPRANOLOL HCL 10 MG PO TABS: 10.0000 mg | ORAL_TABLET | Freq: Four times a day (QID) | ORAL | Status: DC
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
## 2012-08-04 NOTE — Patient Instructions (Addendum)
"  I am Wishes [employer]" by Marylene Buerger and Lyndal Pulley may be helpful for meditation  Try Tegretol one at night for a night or two then take two at night for a day or two or three then could go to three if it is not giving complete benefit.  The intention is to help your racing thoughts, and bipolar mood.  It may help the anxiety too.   Neurontin may cause grogginess or sedation or unsteadiness on the first does or two then it soul be tolerated well.  It is aimed at helping the anxiety.   FOR THE BRAIN HEALTH: CUT BACK/CUT OUT on sugar and carbohydrates, that means very limited fruits and starchy vegetables and very limited grains, breads  The goal is low GLYCEMIC INDEX.  CUT OUT all wheat, rye, or barley  HIGH fat and LOW carbohydrate diet is the KEY.  Eat avocados, eggs, lean meat like grass fed beef and chicken  Nuts and seeds would be good foods as well.   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 Al-Anon Family Groups could be helpful with how to deal with substance abusing family and friends. Or your own issues of being in victim role.  There are only 40 Alanon Family Group meetings a week here in [place].  Online are current listing of those meetings @ [email or web address]  There are [employer].  Search on line and there you can learn the format and can access the schedule for yourself.  Their number is [phone]

## 2012-08-07 ENCOUNTER — Encounter: Payer: Self-pay | Admitting: Family Medicine

## 2012-08-08 ENCOUNTER — Telehealth (HOSPITAL_COMMUNITY): Payer: Self-pay | Admitting: Psychiatry

## 2012-08-08 NOTE — Telephone Encounter (Signed)
Phone message completed in the phone message section.  

## 2012-08-25 ENCOUNTER — Telehealth (HOSPITAL_COMMUNITY): Payer: Self-pay | Admitting: Psychiatry

## 2012-08-25 DIAGNOSIS — F410 Panic disorder [episodic paroxysmal anxiety] without agoraphobia: Secondary | ICD-10-CM

## 2012-08-25 MED ORDER — LORAZEPAM 1 MG PO TABS: 1.0000 mg | ORAL_TABLET | Freq: Four times a day (QID) | ORAL | Status: DC | PRN

## 2012-08-25 NOTE — Telephone Encounter (Signed)
Script for Ativan written and available at the office.

## 2012-08-27 ENCOUNTER — Telehealth (HOSPITAL_COMMUNITY): Payer: Self-pay | Admitting: Psychiatry

## 2012-08-27 NOTE — Telephone Encounter (Signed)
Phone message completed in the phone message section.  

## 2012-09-08 ENCOUNTER — Encounter (HOSPITAL_COMMUNITY): Payer: Self-pay | Admitting: Psychiatry

## 2012-09-08 ENCOUNTER — Ambulatory Visit (INDEPENDENT_AMBULATORY_CARE_PROVIDER_SITE_OTHER): Payer: BC Managed Care – PPO | Admitting: Psychiatry

## 2012-09-08 VITALS — Wt 187.4 lb

## 2012-09-08 DIAGNOSIS — F5105 Insomnia due to other mental disorder: Secondary | ICD-10-CM

## 2012-09-08 DIAGNOSIS — F411 Generalized anxiety disorder: Secondary | ICD-10-CM

## 2012-09-08 DIAGNOSIS — F410 Panic disorder [episodic paroxysmal anxiety] without agoraphobia: Secondary | ICD-10-CM

## 2012-09-08 DIAGNOSIS — F429 Obsessive-compulsive disorder, unspecified: Secondary | ICD-10-CM

## 2012-09-08 DIAGNOSIS — F319 Bipolar disorder, unspecified: Secondary | ICD-10-CM

## 2012-09-08 DIAGNOSIS — F489 Nonpsychotic mental disorder, unspecified: Secondary | ICD-10-CM

## 2012-09-08 DIAGNOSIS — F3130 Bipolar disorder, current episode depressed, mild or moderate severity, unspecified: Secondary | ICD-10-CM

## 2012-09-08 MED ORDER — BUPROPION HCL ER (SR) 100 MG PO TB12: 100.0000 mg | ORAL_TABLET | Freq: Two times a day (BID) | ORAL | Status: DC

## 2012-09-08 MED ORDER — SERTRALINE HCL 100 MG PO TABS: 200.0000 mg | ORAL_TABLET | Freq: Every morning | ORAL | Status: DC

## 2012-09-08 MED ORDER — LORAZEPAM 1 MG PO TABS: 1.0000 mg | ORAL_TABLET | Freq: Four times a day (QID) | ORAL | Status: DC | PRN

## 2012-09-08 MED ORDER — PROPRANOLOL HCL 10 MG PO TABS: 10.0000 mg | ORAL_TABLET | Freq: Four times a day (QID) | ORAL | Status: DC

## 2012-09-08 MED ORDER — TRAZODONE HCL 50 MG PO TABS: 50.0000 mg | ORAL_TABLET | Freq: Every day | ORAL | Status: DC
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
## 2012-09-08 NOTE — Patient Instructions (Addendum)
Flow Free is an awesome free game to play on the iPad or iPhone.  For your post stroke brain damage/encephalopathy, it advised that you get  regular exercise, regular sleep, and  consume good quality, fish oil, 1000 mg twice a day. These 3 things are the foundation of rehabilitating your brain. Staying off all abusable substances including nicotine, caffeine, and refined sugar and avoiding further head injuries are the other important elements in helping you keep your brain working the best it can for you. If memory is a problem then INSTEAD of the fish oil mentioned above, try using Brain Power Basics from [employer].  You can order online or by phone [phone]. It costs $99 for the first month, and $80 monthly thereafter, but that investment in your brain and the recovery of your brain proper functioning would seem worth it.  CUT BACK/CUT OUT on sugar and carbohydrates, that means very limited fruits and starchy vegetables and very limited grains, breads  The goal is low GLYCEMIC INDEX.  CUT OUT all wheat, rye, or barley for the GLUTEN in them.  HIGH fat and LOW carbohydrate diet is the KEY.  Eat avocados, eggs, lean meat like grass fed beef and chicken  Nuts and seeds would be good foods as well.   Stevia is an excellent sweetener.  Safe for the brain.   Almond butter is awesome.  Check out all this on the Internet.  Ask Dr Gerilyn Pilgrim about Aricept for memory.  Call if problems or concerns.

## 2012-09-09 ENCOUNTER — Encounter: Payer: Self-pay | Admitting: Urgent Care

## 2012-09-09 ENCOUNTER — Ambulatory Visit (INDEPENDENT_AMBULATORY_CARE_PROVIDER_SITE_OTHER): Payer: BC Managed Care – PPO | Admitting: Urgent Care

## 2012-09-09 VITALS — BP 135/84 | HR 75 | Temp 98.4°F | Ht 65.0 in | Wt 187.4 lb

## 2012-09-09 DIAGNOSIS — R197 Diarrhea, unspecified: Secondary | ICD-10-CM

## 2012-09-09 DIAGNOSIS — R110 Nausea: Secondary | ICD-10-CM

## 2012-09-09 DIAGNOSIS — K219 Gastro-esophageal reflux disease without esophagitis: Secondary | ICD-10-CM

## 2012-09-09 MED ORDER — PANTOPRAZOLE SODIUM 40 MG PO TBEC: 40.0000 mg | DELAYED_RELEASE_TABLET | Freq: Two times a day (BID) | ORAL | Status: DC

## 2012-09-09 NOTE — Assessment & Plan Note (Signed)
Resolved

## 2012-09-09 NOTE — Progress Notes (Signed)
Referring Provider: Salley Scarlet, MD Primary Care Physician:  Milinda Antis, MD Primary Gastroenterologist: Dr. Darrick Penna   Chief Complaint  Patient presents with  . Follow-up    nausea, diarrhea   HPI:  Ashley Powell is a 52 y.o. female here for follow up for GERD, nausea & diarrhea.  Her GERD is much better.  Denies any nausea or vomiting.  Diarrhea has resolved.  She is taking Protonix 40mg  BID.  She feels very well.  SHe is excited to finally feel better.  She has had some recent medication changes through her psychiatrist. Previously failed prilosec 20mg  daily. Past Medical History  Diagnosis Date  . Depression   . COPD (chronic obstructive pulmonary disease) 2010 ?  Marland Kitchen GERD (gastroesophageal reflux disease)   . Bipolar 1 disorder   . Anxiety   . Panic attacks   . DU (duodenal ulcer)     age 10  . Hypercholesteremia   . Malignant melanoma 1998    Stage III-left shoulder  . Stroke   . Peripheral vascular disease   . Complication of anesthesia     difficulty waking up  . Family history of anesthesia complication     mother N/V unless premedicated  . Obsessive-compulsive disorder     Past Surgical History  Procedure Date  . Tubal ligation 1984  . Cholecystectomy 2001  . Cataract extraction, bilateral 2012  . Melanoma excision 1998    [facility]  . Lymph node dissection 1998  . Tonsillectomy   . Transthoracic echocardiogram 02/2012  . Endarterectomy 04/02/2012    Procedure: ENDARTERECTOMY CAROTID;  Surgeon: Larina Earthly, MD;  Location: [facility] OR;  Service: Vascular;  Laterality: Left;  left carotid endarterectomy with patch angioplasty  . Esophagogastroduodenoscopy 01/01/12    patent stricture in the distal esophagus.small hiatal hernia/abd pain due to IBS, GERD, or gastritis  . Colonoscopy 01/01/12    multiple polyps, internal hemorrhoids, hyperplastic, repeat in 2023    Current Outpatient Prescriptions  Medication Sig Dispense Refill  . aspirin EC 81 MG tablet Take 1  tablet (81 mg total) by mouth daily.  150 tablet  2  . buPROPion (WELLBUTRIN SR) 100 MG 12 hr tablet Take 1 tablet (100 mg total) by mouth 2 (two) times daily. Last dose no later than 4 PM  60 tablet  1  . ezetimibe (ZETIA) 10 MG tablet Take 1 tablet (10 mg total) by mouth daily.  30 tablet  3  . folic acid (FOLVITE) 1 MG tablet Take 1 mg by mouth daily.      Marland Kitchen LORazepam (ATIVAN) 1 MG tablet Take 1 tablet (1 mg total) by mouth every 6 (six) hours as needed for anxiety.  120 tablet  1  . pantoprazole (PROTONIX) 40 MG tablet Take 1 tablet (40 mg total) by mouth 2 (two) times daily.  60 tablet  11  . propranolol (INDERAL) 10 MG tablet Take 1 tablet (10 mg total) by mouth 4 (four) times daily.  120 tablet  1  . sertraline (ZOLOFT) 100 MG tablet Take 2 tablets (200 mg total) by mouth every morning.  60 tablet  1  . traZODone (DESYREL) 50 MG tablet Take 1 tablet (50 mg total) by mouth at bedtime.  30 tablet  1  . vitamin B-12 (CYANOCOBALAMIN) 500 MCG tablet Take 500 mcg by mouth daily.      . Vitamin D, Ergocalciferol, (DRISDOL) 50000 UNITS CAPS Take 50,000 Units by mouth every 7 (seven) days. On Mondays.  Allergies as of 09/09/2012 - Review Complete 09/09/2012  Allergen Reaction Noted  . Geodon (ziprasidone hcl) Other (See Comments) 08/04/2012  . Abilify (aripiprazole) Other (See Comments) 09/08/2012  . Latex Other (See Comments) 12/24/2011  . Statins  06/11/2012    Review of Systems: Gen: Denies any fever, chills, sweats, anorexia, fatigue, weakness, malaise, weight loss, and sleep disorder CV: Denies chest pain, angina, palpitations, syncope, orthopnea, PND, peripheral edema, and claudication. Resp: Denies dyspnea at rest, dyspnea with exercise, cough, sputum, wheezing, coughing up blood, and pleurisy. GI: Denies vomiting blood, jaundice, and fecal incontinence.  Derm: Denies rash, itching, dry skin, hives, moles, warts, or unhealing ulcers.  Psych: Denies depression, anxiety, memory  loss, suicidal ideation, hallucinations, paranoia, and confusion. Heme: Denies bruising, bleeding, and enlarged lymph nodes.  Physical Exam: BP 135/84  Pulse 75  Temp 98.4 F (36.9 C) (Oral)  Ht 5\' 5"  (1.651 m)  Wt 187 lb 6.4 oz (85.004 kg)  BMI 31.18 kg/m2 General:   Alert,  Well-developed, well-nourished, pleasant and cooperative in NAD Eyes:  Sclera clear, no icterus.   Conjunctiva pink. Mouth:  No deformity or lesions, oropharynx pink and moist. Neck:  Supple; no masses or thyromegaly. Heart:  Regular rate and rhythm; no murmurs, clicks, rubs,  or gallops. Abdomen:  Normal bowel sounds.  No bruits.  Soft, non-tender and non-distended without masses, hepatosplenomegaly or hernias noted.  No guarding or rebound tenderness.   Rectal:  Deferred.  Msk:  Symmetrical without gross deformities.  Pulses:  Normal pulses noted. Extremities:  No clubbing or edema. Neurologic:  Alert and oriented x4;  grossly normal neurologically. Skin:  Intact without significant lesions or rashes.

## 2012-09-09 NOTE — Assessment & Plan Note (Addendum)
Well controlled on protonix 40mg  BID  Decrease Protonix to 40mg  daily, however if symptoms return you can continue twice daily Follow up with your doctor about bone density screening Office visit in 1 year or sooner if needed

## 2012-09-09 NOTE — Patient Instructions (Addendum)
You may try & decrease Protonix to 40mg  daily, however if symptoms return you can continue twice daily Follow up with your doctor about bone density screening Office visit in 1 year or sooner if needed

## 2012-09-10 NOTE — Progress Notes (Signed)
Faxed to PCP

## 2012-10-04 ENCOUNTER — Other Ambulatory Visit: Payer: Self-pay

## 2012-10-10 ENCOUNTER — Encounter: Payer: Self-pay | Admitting: Family Medicine

## 2012-10-12 ENCOUNTER — Other Ambulatory Visit: Payer: Self-pay | Admitting: Family Medicine

## 2012-10-12 MED ORDER — COLESTIPOL HCL 5 G PO GRAN: 5.0000 g | GRANULES | Freq: Two times a day (BID) | ORAL | Status: DC

## 2012-10-13 ENCOUNTER — Encounter: Payer: Self-pay | Admitting: Neurosurgery

## 2012-10-14 ENCOUNTER — Encounter: Payer: Self-pay | Admitting: Neurosurgery

## 2012-10-14 ENCOUNTER — Ambulatory Visit (INDEPENDENT_AMBULATORY_CARE_PROVIDER_SITE_OTHER): Payer: BC Managed Care – PPO | Admitting: Neurosurgery

## 2012-10-14 ENCOUNTER — Other Ambulatory Visit (INDEPENDENT_AMBULATORY_CARE_PROVIDER_SITE_OTHER): Payer: BC Managed Care – PPO | Admitting: *Deleted

## 2012-10-14 ENCOUNTER — Other Ambulatory Visit: Payer: Self-pay | Admitting: *Deleted

## 2012-10-14 DIAGNOSIS — I6529 Occlusion and stenosis of unspecified carotid artery: Secondary | ICD-10-CM

## 2012-10-14 DIAGNOSIS — Z48812 Encounter for surgical aftercare following surgery on the circulatory system: Secondary | ICD-10-CM

## 2012-10-14 NOTE — Progress Notes (Signed)
[facility] [place] Carot[facility] Office Note  CC: Carotid surveillance Referring Physician: Early  History of Present Illness: 52 year old female patient of Dr. Arbie Cookey status post left CEA in August 2013. The patient denies signs or symptoms of CVA, TIA, amaurosis fugax. 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 She is taking Lamictal per his direction.  Past Medical History  Diagnosis Date  . Depression   . COPD (chronic obstructive pulmonary disease) 2010 ?  Marland Kitchen GERD (gastroesophageal reflux disease)   . Bipolar 1 disorder   . Anxiety   . Panic attacks   . DU (duodenal ulcer)     age 48  . Hypercholesteremia   . Malignant melanoma 1998    Stage III-left shoulder  . Stroke   . Peripheral vascular disease   . Complication of anesthesia     difficulty waking up  . Family history of anesthesia complication     mother N/V unless premedicated  . Obsessive-compulsive disorder   . Carotid artery occlusion     ROS: [x]  Positive   [ ]  Denies    General: [ ]  Weight loss, [ ]  Fever, [ ]  chills Neurologic: [ ]  Dizziness, [ ]  Blackouts, [ ]  Seizure [ ]  Stroke, [ ]  "Mini stroke", [ ]  Slurred speech, [ ]  Temporary blindness; [x ] weakness in arms or legs, [ ]  Hoarseness Cardiac: [ ]  Chest pain/pressure, [ ]  Shortness of breath at rest [ ]  Shortness of breath with exertion, [ ]  Atrial fibrillation or irregular heartbeat Vascular: [ ]  Pain in legs with walking, [ ]  Pain in legs at rest, [ ]  Pain in legs at night,  [ ]  Non-healing ulcer, [ ]  Blood clot in vein/DVT,   Pulmonary: [ ]  Home oxygen, [ ]  Productive cough, [ ]  Coughing up blood, [ ]  Asthma,  [ ]  Wheezing Musculoskeletal:  [ ]  Arthritis, [ ]  Low back pain, [ ]  Joint pain Hematologic: [ ]  Easy Bruising, [ ]   Anemia; [ ]  Hepatitis Gastrointestinal: [ ]  Blood in stool, [ ]  Gastroesophageal Reflux/heartburn, [ ]  Trouble swallowing Urinary: [ ]  chronic Kidney disease, [ ]  on HD - [ ]  MWF or [ ]  TTHS, [ ]  Burning with urination, [ ]  Difficulty urinating Skin: [ ]  Rashes, [ ]  Wounds Psychological: [ ]  Anxiety, [ ]  Depression   Social History History  Substance Use Topics  . Smoking status: Current Every Day Smoker -- 1.50 packs/day for 22 years    Types: Cigarettes  . Smokeless tobacco: Never Used     Comment: pt states that she likes to smoke  . Alcohol Use: No    Family History Family History  Problem Relation Age of Onset  . Depression Mother   . Hyperlipidemia Mother   . Anxiety disorder Mother   . Hypertension Father   . Hyperlipidemia Father   . Heart disease Father     before age 80  . Diabetes Father   . Heart attack Father   . OCD Father   . Hypertension Brother   . Heart disease Brother   . Hyperlipidemia Brother   . Depression Brother   . Heart attack Brother   . Bipolar disorder Brother   . Drug abuse Brother   . Schizophrenia Neg Hx   .  Physical abuse Neg Hx   . Sexual abuse Neg Hx   . ADD / ADHD Neg Hx   . Alcohol abuse Neg Hx   . Paranoid behavior Neg Hx   . Seizures Neg Hx   . Dementia Maternal Grandfather     Allergies  Allergen Reactions  . Geodon (Ziprasidone Hcl) Other (See Comments)    Body convulsions.   . Abilify (Aripiprazole) Other (See Comments)    Headaches and stayed up for 3 days.  . Latex Other (See Comments)    Reaction: cause skin to peel off   . Statins     Current Outpatient Prescriptions  Medication Sig Dispense Refill  . aspirin EC 81 MG tablet Take 1 tablet (81 mg total) by mouth daily.  150 tablet  2  . buPROPion (WELLBUTRIN SR) 100 MG 12 hr tablet Take 1 tablet (100 mg total) by mouth 2 (two) times daily. Last dose no later than 4 PM  60 tablet  1  . diclofenac (VOLTAREN) 75 MG EC tablet Take 75 mg by mouth 2 (two) times  daily.      Marland Kitchen ezetimibe (ZETIA) 10 MG tablet Take 1 tablet (10 mg total) by mouth daily.  30 tablet  3  . folic acid (FOLVITE) 1 MG tablet Take 1 mg by mouth daily.      Marland Kitchen lamoTRIgine (LAMICTAL) 25 MG tablet Take 25 mg by mouth daily.      Marland Kitchen LORazepam (ATIVAN) 1 MG tablet Take 1 tablet (1 mg total) by mouth every 6 (six) hours as needed for anxiety.  120 tablet  1  . pantoprazole (PROTONIX) 40 MG tablet Take 1 tablet (40 mg total) by mouth 2 (two) times daily.  60 tablet  11  . propranolol (INDERAL) 10 MG tablet Take 1 tablet (10 mg total) by mouth 4 (four) times daily.  120 tablet  1  . sertraline (ZOLOFT) 100 MG tablet Take 2 tablets (200 mg total) by mouth every morning.  60 tablet  1  . traZODone (DESYREL) 50 MG tablet Take 1 tablet (50 mg total) by mouth at bedtime.  30 tablet  1  . vitamin B-12 (CYANOCOBALAMIN) 500 MCG tablet Take 500 mcg by mouth daily.      . Vitamin D, Ergocalciferol, (DRISDOL) 50000 UNITS CAPS Take 50,000 Units by mouth every 7 (seven) days. On Mondays.      . colestipol (COLESTID) 5 G granules Take 5 g by mouth 2 (two) times daily.  500 g  6   No current facility-administered medications for this visit.    Physical Examination  Filed Vitals:   10/14/12 1204  BP: 114/72  Pulse: 61  Resp:     Body mass index is 31.12 kg/(m^2).  General:  WDWN in NAD Gait: Normal HEENT: WNL Eyes: Pupils equal Pulmonary: normal non-labored breathing , without Rales, rhonchi,  wheezing Cardiac: RRR, without  Murmurs, rubs or gallops; Abdomen: soft, NT, no masses Skin: no rashes, ulcers noted  Vascular Exam Pulses: 3+ radial pulses bilaterally Carotid bruits: Carotid pulses to auscultation no bruits are heard Extremities without ischemic changes, no Gangrene , no cellulitis; no open wounds;  Musculoskeletal: no muscle wasting or atrophy   Neurologic: A&O X 3; Appropriate Affect ; SENSATION: normal; MOTOR FUNCTION:  moving all extremities equally. Speech is  fluent/normal  Non-Invasive Vascular Imaging CAROTID DUPLEX 10/14/2012  Right ICA 20 - 39 % stenosis Left ICA 0 - 19% stenosis   ASSESSMENT/PLAN: Asymptomatic patient that will followup in  6 months with repeat carotid duplex. She was encouraged to continue followup with her neurologist for which she agreed. The patient's questions were encouraged and answered, she is in agreement with this plan.  Lauree Chandler ANP   Clinic MD: [facility]

## 2012-10-20 ENCOUNTER — Ambulatory Visit (HOSPITAL_COMMUNITY): Payer: Self-pay | Admitting: Psychiatry

## 2012-10-29 ENCOUNTER — Ambulatory Visit (INDEPENDENT_AMBULATORY_CARE_PROVIDER_SITE_OTHER): Payer: BC Managed Care – PPO | Admitting: Psychiatry

## 2012-10-29 ENCOUNTER — Encounter (HOSPITAL_COMMUNITY): Payer: Self-pay | Admitting: Psychiatry

## 2012-10-29 VITALS — Wt 191.0 lb

## 2012-10-29 DIAGNOSIS — F429 Obsessive-compulsive disorder, unspecified: Secondary | ICD-10-CM

## 2012-10-29 DIAGNOSIS — F5105 Insomnia due to other mental disorder: Secondary | ICD-10-CM

## 2012-10-29 DIAGNOSIS — M62830 Muscle spasm of back: Secondary | ICD-10-CM

## 2012-10-29 DIAGNOSIS — F319 Bipolar disorder, unspecified: Secondary | ICD-10-CM

## 2012-10-29 DIAGNOSIS — F489 Nonpsychotic mental disorder, unspecified: Secondary | ICD-10-CM

## 2012-10-29 DIAGNOSIS — Z720 Tobacco use: Secondary | ICD-10-CM

## 2012-10-29 DIAGNOSIS — F410 Panic disorder [episodic paroxysmal anxiety] without agoraphobia: Secondary | ICD-10-CM

## 2012-10-29 DIAGNOSIS — F3130 Bipolar disorder, current episode depressed, mild or moderate severity, unspecified: Secondary | ICD-10-CM

## 2012-10-29 DIAGNOSIS — F411 Generalized anxiety disorder: Secondary | ICD-10-CM

## 2012-10-29 MED ORDER — METHOCARBAMOL 500 MG PO TABS: 500.0000 mg | ORAL_TABLET | Freq: Four times a day (QID) | ORAL | Status: DC

## 2012-10-29 MED ORDER — TRAZODONE HCL 50 MG PO TABS: 50.0000 mg | ORAL_TABLET | Freq: Every day | ORAL | Status: DC

## 2012-10-29 MED ORDER — PROPRANOLOL HCL 10 MG PO TABS: 10.0000 mg | ORAL_TABLET | Freq: Four times a day (QID) | ORAL | Status: DC

## 2012-10-29 MED ORDER — BUPROPION HCL ER (SR) 100 MG PO TB12: 100.0000 mg | ORAL_TABLET | Freq: Two times a day (BID) | ORAL | Status: DC

## 2012-10-29 MED ORDER — LORAZEPAM 1 MG PO TABS: 1.0000 mg | ORAL_TABLET | Freq: Four times a day (QID) | ORAL | Status: DC | PRN

## 2012-10-29 MED ORDER — SERTRALINE HCL 100 MG PO TABS: 200.0000 mg | ORAL_TABLET | Freq: Every morning | ORAL | Status: DC
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
## 2012-10-29 NOTE — Patient Instructions (Signed)
Yoga is a very helpful exercise method.  On TV, on line, or by DVD Adelfa Koh is a source of high quality information about yoga and videos on yoga.  Renee Ramus is the world's number one video yoga instructor according to some experts.  There are exceptional health benefits that can be achieved through yoga.  The main principles of yoga is acceptance, no competition, no comparison, and no judgement.  It is exceptional in helping people meditate and get to a very relaxed state.   Positions that may be helpful for relaxing your back include monkey, plank and side plank.  Lying spinal twist is also very helpful.    CUT BACK/CUT OUT on sugar and carbohydrates, that means very limited fruits and starchy vegetables and very limited grains, breads  The goal is low GLYCEMIC INDEX.  CUT OUT all wheat, rye, or barley for the GLUTEN in them.  HIGH fat and LOW carbohydrate diet is the KEY.  Eat avocados, eggs, lean meat like grass fed beef and chicken  Nuts and seeds would be good foods as well.   Stevia is an excellent sweetener.  Safe for the brain.   [name] is also a good safe sweetener.  Almond butter is awesome.  Check out all this on the Internet.  Dr Heber [place] is on the Internet with some good info about this.   Call if problems or concerns.

## 2012-10-30 NOTE — Progress Notes (Signed)
REVIEWED.  

## 2013-01-15 ENCOUNTER — Other Ambulatory Visit (HOSPITAL_COMMUNITY): Payer: Self-pay | Admitting: Nurse Practitioner

## 2013-01-15 ENCOUNTER — Ambulatory Visit (HOSPITAL_COMMUNITY)
Admission: RE | Admit: 2013-01-15 | Discharge: 2013-01-15 | Disposition: A | Discharge status: Home / Self Care | Payer: BC Managed Care – PPO | Source: Ambulatory Visit | Attending: Nurse Practitioner | Admitting: Nurse Practitioner

## 2013-01-15 DIAGNOSIS — R209 Unspecified disturbances of skin sensation: Secondary | ICD-10-CM

## 2013-01-15 DIAGNOSIS — R51 Headache: Secondary | ICD-10-CM | POA: Insufficient documentation

## 2013-01-15 DIAGNOSIS — R519 Headache, unspecified: Secondary | ICD-10-CM

## 2013-01-29 ENCOUNTER — Ambulatory Visit (HOSPITAL_COMMUNITY): Payer: Self-pay | Admitting: Psychiatry

## 2013-02-04 ENCOUNTER — Encounter (HOSPITAL_COMMUNITY): Payer: Self-pay | Admitting: Psychiatry

## 2013-02-04 ENCOUNTER — Ambulatory Visit (INDEPENDENT_AMBULATORY_CARE_PROVIDER_SITE_OTHER): Payer: BC Managed Care – PPO | Admitting: Psychiatry

## 2013-02-04 VITALS — BP 125/65 | HR 62 | Ht 65.25 in | Wt 196.0 lb

## 2013-02-04 DIAGNOSIS — F5105 Insomnia due to other mental disorder: Secondary | ICD-10-CM

## 2013-02-04 DIAGNOSIS — F411 Generalized anxiety disorder: Secondary | ICD-10-CM

## 2013-02-04 DIAGNOSIS — F3130 Bipolar disorder, current episode depressed, mild or moderate severity, unspecified: Secondary | ICD-10-CM

## 2013-02-04 DIAGNOSIS — F429 Obsessive-compulsive disorder, unspecified: Secondary | ICD-10-CM

## 2013-02-04 DIAGNOSIS — G894 Chronic pain syndrome: Secondary | ICD-10-CM | POA: Insufficient documentation

## 2013-02-04 DIAGNOSIS — F489 Nonpsychotic mental disorder, unspecified: Secondary | ICD-10-CM

## 2013-02-04 DIAGNOSIS — Z720 Tobacco use: Secondary | ICD-10-CM

## 2013-02-04 DIAGNOSIS — F410 Panic disorder [episodic paroxysmal anxiety] without agoraphobia: Secondary | ICD-10-CM

## 2013-02-04 DIAGNOSIS — F319 Bipolar disorder, unspecified: Secondary | ICD-10-CM

## 2013-02-04 MED ORDER — DULOXETINE HCL 20 MG PO CPEP: ORAL_CAPSULE | ORAL | Status: DC

## 2013-02-04 MED ORDER — LORAZEPAM 1 MG PO TABS: 1.0000 mg | ORAL_TABLET | Freq: Four times a day (QID) | ORAL | Status: DC | PRN

## 2013-02-04 MED ORDER — TRAZODONE HCL 50 MG PO TABS: 50.0000 mg | ORAL_TABLET | Freq: Every day | ORAL | Status: DC

## 2013-02-04 NOTE — Patient Instructions (Signed)
CUT BACK/CUT OUT on sugar and carbohydrates, that means very limited fruits and starchy vegetables and very limited grains, breads  The goal is low GLYCEMIC INDEX.  CUT OUT all wheat, rye, or barley for the GLUTEN in them.  HIGH fat and LOW carbohydrate diet is the KEY.  Eat avocados, eggs, lean meat like grass fed beef and chicken  Nuts and seeds would be good foods as well.   Stevia is an excellent sweetener.  Safe for the brain.   [name] is also a good safe sweetener, not the baking blend form of Truvia  Almond butter is awesome.  Check out all this on the Internet.  Dr Heber [place] is on the Internet with some good info about this.   http://www.drperlmutter.com is where that is.  An excellent site for info on this diet is http://paleoleap.com  [employer] makes dark chocolate that is sweetened with Stevia that is safe.  William Dalton is a soda sweetened with Stevia and is available at [employer] among other places.  Stop Zoloft  After 3 days start with one Cymbalta a day and then in a week go to two a day  Then after about 2 or 3 more week could cut the Wellbutrin in HALF and take 1/2 tab twice a day for about a week, then 1/2 in AM for another week then stop.  Call if problems or concerns.
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
## 2013-02-05 ENCOUNTER — Other Ambulatory Visit: Payer: Self-pay | Admitting: Family Medicine

## 2013-02-05 DIAGNOSIS — Z139 Encounter for screening, unspecified: Secondary | ICD-10-CM

## 2013-02-10 ENCOUNTER — Ambulatory Visit (HOSPITAL_COMMUNITY)
Admission: RE | Admit: 2013-02-10 | Discharge: 2013-02-10 | Disposition: A | Discharge status: Home / Self Care | Payer: BC Managed Care – PPO | Source: Ambulatory Visit | Attending: Family Medicine | Admitting: Family Medicine

## 2013-02-10 DIAGNOSIS — Z1231 Encounter for screening mammogram for malignant neoplasm of breast: Secondary | ICD-10-CM | POA: Insufficient documentation

## 2013-02-10 DIAGNOSIS — Z139 Encounter for screening, unspecified: Secondary | ICD-10-CM

## 2013-04-06 ENCOUNTER — Ambulatory Visit (INDEPENDENT_AMBULATORY_CARE_PROVIDER_SITE_OTHER): Payer: BC Managed Care – PPO | Admitting: Psychiatry

## 2013-04-06 ENCOUNTER — Encounter (HOSPITAL_COMMUNITY): Payer: Self-pay | Admitting: Psychiatry

## 2013-04-06 ENCOUNTER — Ambulatory Visit (HOSPITAL_COMMUNITY): Payer: Self-pay | Admitting: Psychiatry

## 2013-04-06 VITALS — BP 150/88 | Ht 65.0 in | Wt 196.0 lb

## 2013-04-06 DIAGNOSIS — F489 Nonpsychotic mental disorder, unspecified: Secondary | ICD-10-CM

## 2013-04-06 DIAGNOSIS — F5105 Insomnia due to other mental disorder: Secondary | ICD-10-CM

## 2013-04-06 DIAGNOSIS — F3130 Bipolar disorder, current episode depressed, mild or moderate severity, unspecified: Secondary | ICD-10-CM

## 2013-04-06 DIAGNOSIS — F410 Panic disorder [episodic paroxysmal anxiety] without agoraphobia: Secondary | ICD-10-CM

## 2013-04-06 DIAGNOSIS — F411 Generalized anxiety disorder: Secondary | ICD-10-CM

## 2013-04-06 DIAGNOSIS — IMO0002 Reserved for concepts with insufficient information to code with codable children: Secondary | ICD-10-CM

## 2013-04-06 MED ORDER — DULOXETINE HCL 60 MG PO CPEP: 60.0000 mg | ORAL_CAPSULE | Freq: Every morning | ORAL | Status: DC

## 2013-04-06 MED ORDER — LORAZEPAM 1 MG PO TABS: 1.0000 mg | ORAL_TABLET | Freq: Four times a day (QID) | ORAL | Status: DC | PRN

## 2013-04-06 MED ORDER — PROPRANOLOL HCL 10 MG PO TABS: 10.0000 mg | ORAL_TABLET | Freq: Four times a day (QID) | ORAL | Status: DC

## 2013-04-06 MED ORDER — TRAZODONE HCL 50 MG PO TABS: 50.0000 mg | ORAL_TABLET | Freq: Every day | ORAL | Status: DC
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
## 2013-04-08 ENCOUNTER — Encounter: Payer: Self-pay | Admitting: Family Medicine

## 2013-04-08 ENCOUNTER — Ambulatory Visit (INDEPENDENT_AMBULATORY_CARE_PROVIDER_SITE_OTHER): Payer: BC Managed Care – PPO | Admitting: Family Medicine

## 2013-04-08 VITALS — BP 122/78 | HR 92 | Temp 98.1°F | Ht 64.25 in | Wt 196.0 lb

## 2013-04-08 DIAGNOSIS — F17200 Nicotine dependence, unspecified, uncomplicated: Secondary | ICD-10-CM

## 2013-04-08 DIAGNOSIS — R7303 Prediabetes: Secondary | ICD-10-CM

## 2013-04-08 DIAGNOSIS — E785 Hyperlipidemia, unspecified: Secondary | ICD-10-CM

## 2013-04-08 DIAGNOSIS — Z720 Tobacco use: Secondary | ICD-10-CM

## 2013-04-08 DIAGNOSIS — R7301 Impaired fasting glucose: Secondary | ICD-10-CM

## 2013-04-08 DIAGNOSIS — J449 Chronic obstructive pulmonary disease, unspecified: Secondary | ICD-10-CM

## 2013-04-08 DIAGNOSIS — R7309 Other abnormal glucose: Secondary | ICD-10-CM

## 2013-04-08 LAB — HEMOGLOBIN A1C, FINGERSTICK: Hgb A1C (fingerstick): 6.5 % — ABNORMAL HIGH (ref <5.7)

## 2013-04-08 MED ORDER — ALBUTEROL SULFATE HFA 108 (90 BASE) MCG/ACT IN AERS: 2.0000 | INHALATION_SPRAY | RESPIRATORY_TRACT | Status: DC | PRN

## 2013-04-08 NOTE — Progress Notes (Signed)
  Subjective:    Patient ID: Ashley Powell, female    DOB: [birth year], 52 y.o.   MRN: [id number]  HPI  Pt here with elevated glucose of 230 at a wellness fair.  Hyperlipidemia- total cholesterol was 290 at wellness fair, Hb was 15.7. She could not afford fenofibrate, did not like the welchol. Wants to try statin drug again, not sure if this or geodon which she was on was causing the muscle tics/spasms Continues to follow with neurology and psychiatry, no recent changes, meds reviewed Has had a few steroid injections in herback recently by neurology COPD- Has noticed some wheezing on and off past few months, no SOB, no cough.. Continues to smoke  Review of Systems   GEN- denies fatigue, fever, weight loss,weakness, recent illness HEENT- denies eye drainage, change in vision, nasal discharge, CVS- denies chest pain, palpitations RESP- denies SOB, cough, +wheeze ABD- denies N/V, change in stools, abd pain GU- denies dysuria, hematuria, dribbling, incontinence MSK- denies joint pain, muscle aches, injury Neuro- denies headache, dizziness, syncope, seizure activity      Objective:   Physical Exam GEN- NAD, alert and oriented x3 HEENT- PERRL, EOMI, non injected sclera, pink conjunctiva, MMM, oropharynx clear Neck- Supple, LAD CVS- RRR, no murmur RESP-CTAB EXT- No edema Pulses- Radial, DP- 2+        Assessment & Plan:

## 2013-04-08 NOTE — Patient Instructions (Addendum)
Try the crestor 10mg  for cholesterol Blood pressure looks good Watch your  Sugar intake, you are pre-diabetic  We will call with lab results F/U 3 months- will  Need cholesterol and sugar level checked

## 2013-04-09 LAB — CBC WITH DIFFERENTIAL/PLATELET
Basophils Absolute: 0.1 10*3/uL (ref 0.0–0.1)
Eosinophils Relative: 3 % (ref 0–5)
HCT: 45.3 % (ref 36.0–46.0)
Lymphocytes Relative: 22 % (ref 12–46)
MCHC: 34 g/dL (ref 30.0–36.0)
MCV: 86.5 fL (ref 78.0–100.0)
Monocytes Absolute: 1.2 10*3/uL — ABNORMAL HIGH (ref 0.1–1.0)
Monocytes Relative: 8 % (ref 3–12)
RDW: 13.9 % (ref 11.5–15.5)

## 2013-04-09 LAB — COMPREHENSIVE METABOLIC PANEL
ALT: 22 U/L (ref 0–35)
AST: 16 U/L (ref 0–37)
Creat: 0.69 mg/dL (ref 0.50–1.10)
Total Bilirubin: 0.4 mg/dL (ref 0.3–1.2)

## 2013-04-09 NOTE — Assessment & Plan Note (Signed)
Elevated sugar, A1C right at cuff, discussed weight and sugar intake Will not start meds today, we will work on dietary control, plan to recheck A1C in 3 months

## 2013-04-09 NOTE — Assessment & Plan Note (Signed)
She has not had inhalers in many years Will rx albuterol today and see how she does with this Needs to quit smoking

## 2013-04-09 NOTE — Assessment & Plan Note (Signed)
Counseled on tobacco cessation 

## 2013-04-09 NOTE — Assessment & Plan Note (Signed)
She wants to try statin drug again, could not afford fenofibrate Given crestor 10mg  samples,

## 2013-04-10 ENCOUNTER — Ambulatory Visit (HOSPITAL_COMMUNITY): Payer: Self-pay | Admitting: Psychology

## 2013-04-13 ENCOUNTER — Encounter: Payer: Self-pay | Admitting: Family

## 2013-04-14 ENCOUNTER — Other Ambulatory Visit (INDEPENDENT_AMBULATORY_CARE_PROVIDER_SITE_OTHER): Payer: BC Managed Care – PPO | Admitting: Vascular Surgery

## 2013-04-14 ENCOUNTER — Ambulatory Visit (INDEPENDENT_AMBULATORY_CARE_PROVIDER_SITE_OTHER): Payer: BC Managed Care – PPO | Admitting: Family

## 2013-04-14 ENCOUNTER — Encounter: Payer: Self-pay | Admitting: Family

## 2013-04-14 DIAGNOSIS — Z48812 Encounter for surgical aftercare following surgery on the circulatory system: Secondary | ICD-10-CM | POA: Insufficient documentation

## 2013-04-14 DIAGNOSIS — I6529 Occlusion and stenosis of unspecified carotid artery: Secondary | ICD-10-CM
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
## 2013-04-14 NOTE — Patient Instructions (Signed)
Stroke Prevention Some medical conditions and some behaviors are associated with an increased chance of having a stroke. You may prevent a stroke by making healthy choices and managing medical conditions. You can reduce your risk of having a stroke by:  Staying physically active. It is recommended that you get at least 30 minutes of activity on most or all days.   Stop smoking.   Limiting alcohol use. Moderate alcohol use is considered to be: No more than 2 drinks per day for men. No more than 1 drink per day for non-pregnant women.   DIET.  A low fat, low cholesterol, low salt and high fiber diet with servings of fruits and vegetables daily will help .   Managing your cholesterol levels.. Take any prescribed medications to control cholesterol as directed by your caregiver.   Managing your diabetes. Diabetic diet as recommended by the ADA. Take any prescribed medications to control diabetes as directed by your caregiver.   Controlling your high blood pressure (hypertension). It is very important to take any prescribed medications to control hypertension .   Aspirin: Take Aspirin daily as prescribed by your physician. Do not take aspirin with blood thinners unless prescribed by your Physician  You may be on "blood thinners" (anticoagulants) Coumadin, Plavix, Agrennox, Effient. All these medications are helpful in reducing the risk of forming abnormal blood clots. If you have the irregular heart rhythm of atrial fibrillation, you may be on a blood thinner.  Be sure you understand all your medication instructions.   SIGNS OF STROKE: SEEK IMMEDIATE MEDICAL CARE IF: You have sudden weakness or numbness of the face, arm, or leg, especially on 1 side of the body.  You have sudden confusion.  You have trouble speaking (aphasia) or understanding.  You have sudden trouble seeing in 1 or both eyes.  You have sudden trouble walking.  You have dizziness.  You have a loss of balance or coordination.   You have a sudden severe headache with no known cause.    You have new chest pain, angina, or an irregular heartbeat.   ANY OF THESE SYMPTOMS MAY REPRESENT A SERIOUS PROBLEM THAT IS AN EMERGENCY. Do not wait to see if the symptoms will go away. Get medical help right away. Call your local emergency services (911 in [place] DO NOT drive yourself to the hospital. Stroke Prevention Some medical conditions and some behaviors are associated with an increased chance of having a stroke. You may prevent a stroke by making healthy choices and managing medical conditions. You can reduce your risk of having a stroke by:  Staying physically active. It is recommended that you get at least 30 minutes of activity on most or all days.   Not smoking.   Limiting alcohol use. Moderate alcohol use is considered to be:   No more than 2 drinks per day for men.   No more than 1 drink per day for nonpregnant women.   Eating healthy foods. Certain diets may be prescribed to address high blood pressure, high cholesterol, diabetes, or obesity. A diet that includes 5 or more servings of fruits and vegetables a day may reduce the risk of stroke.   Managing your cholesterol levels. A low saturated fat, low trans fat, low cholesterol, and high fiber diet may control cholesterol levels. Take any prescribed medications to control cholesterol as directed by your caregiver.   Managing your diabetes. A controlled carbohydrate, controlled sugar diet is recommended to manage diabetes. Take any prescribed medications to   control diabetes as directed by your caregiver.   Controlling your high blood pressure (hypertension). A low salt (sodium), low saturated fat, low trans fat, and low cholesterol diet is recommended to manage high blood pressure. Take any prescribed medications to control hypertension as directed by your caregiver.   Maintaining a healthy weight. A reduced calorie, low sodium, low saturated fat, low trans fat, low  cholesterol diet is recommended to manage weight.   Stopping drug abuse.   Taking medications as directed by your caregiver. For some individuals, aspirin or "blood thinners" (anticoagulants) are helpful in reducing the risk of forming abnormal blood clots that can lead to stroke. If you have the irregular heart rhythm of atrial fibrillation, you should be on a blood thinner unless there is a good reason you cannot take them. Be sure you understand all your medication instructions.  SEEK IMMEDIATE MEDICAL CARE IF:  You have sudden weakness or numbness of the face, arm, or leg, especially on 1 side of the body.   You have sudden confusion.   You have trouble speaking (aphasia) or understanding.   You have sudden trouble seeing in 1 or both eyes.   You have sudden trouble walking.   You have a loss of balance or coordination.   You have a sudden severe headache with no known cause.    ANY OF THESE SYMPTOMS MAY REPRESENT A SERIOUS PROBLEM THAT IS AN EMERGENCY. Do not wait to see if the symptoms will go away. Get medical help right away. Call your local emergency services (911 in [place] DO NOT drive yourself to the hospital. Document Released: 09/13/2004 Document Re-Released: 01/24/2010 Exit[employer] Patient Information 2011 [employer].  

## 2013-04-15 NOTE — Addendum Note (Signed)
Addended by: Sharee Pimple on: 04/15/2013 11:04 AM   Modules accepted: Orders

## 2013-04-22 ENCOUNTER — Ambulatory Visit (INDEPENDENT_AMBULATORY_CARE_PROVIDER_SITE_OTHER): Payer: BC Managed Care – PPO | Admitting: Family Medicine

## 2013-04-22 ENCOUNTER — Encounter: Payer: Self-pay | Admitting: Family Medicine

## 2013-04-22 VITALS — BP 108/60 | HR 68 | Temp 96.6°F | Resp 16 | Wt 197.0 lb

## 2013-04-22 DIAGNOSIS — N950 Postmenopausal bleeding: Secondary | ICD-10-CM | POA: Insufficient documentation

## 2013-04-22 LAB — FSH/LH: LH: 55.6 m[IU]/mL

## 2013-04-22 NOTE — Assessment & Plan Note (Signed)
Check hormones levels Transvaginal US to be obtained Refer to GYN Since bleeding is tapering off, and with her other co-morbidites will not start any hormone therapy, discussed with pt and she agrees

## 2013-04-22 NOTE — Progress Notes (Signed)
  Subjective:    Patient ID: Ashley Powell, female    DOB: [birth year], 52 y.o.   MRN: [id number]  HPI Patient here with vaginal bleeding for the past 5 days. She's not had a menstrual cycle in 6 years. Vaginal bleeding associated with lowe abdominal, cramping. She's not sexually active. Has not had any vaginal discharge. Denies any rectal bleeding or dysuria. She had heavy bleeding and clots on Friday and Saturday this is now tapered down to a light brownish red bleeding. She now only needs one to 2 pads a day.   Review of Systems - per abovE     Objective:   Physical Exam GEN-NAD,alert and oriented x 3 ABD-NABS,soft,mild TTP lower quadrants, no rebound, no gaurding GU- Deferred       Assessment & Plan:

## 2013-04-22 NOTE — Patient Instructions (Signed)
Referral to GYN  Ultrasound at [facility]  We will call with hormone labs done

## 2013-04-24 ENCOUNTER — Ambulatory Visit (HOSPITAL_COMMUNITY)
Admission: RE | Admit: 2013-04-24 | Discharge: 2013-04-24 | Disposition: A | Discharge status: Home / Self Care | Payer: BC Managed Care – PPO | Source: Ambulatory Visit | Attending: Family Medicine | Admitting: Family Medicine

## 2013-04-24 DIAGNOSIS — N950 Postmenopausal bleeding: Secondary | ICD-10-CM

## 2013-05-06 ENCOUNTER — Other Ambulatory Visit: Payer: Self-pay | Admitting: Family Medicine

## 2013-05-06 ENCOUNTER — Encounter: Payer: Self-pay | Admitting: Family Medicine

## 2013-05-06 MED ORDER — ROSUVASTATIN CALCIUM 10 MG PO TABS: 10.0000 mg | ORAL_TABLET | Freq: Every day | ORAL | Status: DC

## 2013-05-06 NOTE — Telephone Encounter (Signed)
Med refilled per pt request via [facility]!

## 2013-05-13 ENCOUNTER — Other Ambulatory Visit (HOSPITAL_COMMUNITY)
Admission: RE | Admit: 2013-05-13 | Discharge: 2013-05-13 | Disposition: A | Discharge status: Home / Self Care | Payer: Medicare Other | Source: Ambulatory Visit | Attending: Obstetrics & Gynecology | Admitting: Obstetrics & Gynecology

## 2013-05-13 ENCOUNTER — Encounter: Payer: Self-pay | Admitting: Obstetrics & Gynecology

## 2013-05-13 ENCOUNTER — Ambulatory Visit (INDEPENDENT_AMBULATORY_CARE_PROVIDER_SITE_OTHER): Payer: Medicare Other | Admitting: Obstetrics & Gynecology

## 2013-05-13 VITALS — BP 140/60 | Ht 65.0 in | Wt 197.0 lb

## 2013-05-13 DIAGNOSIS — N950 Postmenopausal bleeding: Secondary | ICD-10-CM

## 2013-05-13 NOTE — Progress Notes (Signed)
Patient ID: Ashley Powell, female   DOB: 1961-06-04, 52 y.o.   MRN: [id number] Ashley Powell is a 52 y.o. G1P1 with Patient's last menstrual period was 04/18/2013. prior to that 6 1/2 years ago. Bleeding in august was like a period, now spotting for 3 days since then  Sonogram reveals 2.5 mm endometrial stripe otherwise normal sonogram  Ordinarily would not have done biopsy but the bleeding was period like  3 mm endometrial pipelle used and scant tissue returned  Follow up in 1 week for follow up

## 2013-05-20 ENCOUNTER — Encounter: Payer: Self-pay | Admitting: Obstetrics & Gynecology

## 2013-05-20 ENCOUNTER — Ambulatory Visit (INDEPENDENT_AMBULATORY_CARE_PROVIDER_SITE_OTHER): Payer: Medicare Other | Admitting: Obstetrics & Gynecology

## 2013-05-20 VITALS — BP 140/78 | Ht 65.0 in | Wt 196.0 lb

## 2013-05-20 DIAGNOSIS — N950 Postmenopausal bleeding: Secondary | ICD-10-CM

## 2013-05-20 MED ORDER — MEDROXYPROGESTERONE ACETATE 10 MG PO TABS: 10.0000 mg | ORAL_TABLET | Freq: Every day | ORAL | Status: DC

## 2013-05-20 NOTE — Progress Notes (Signed)
Patient ID: Ashley Powell, female   DOB: Mar 25, 1961, 52 y.o.   MRN: [id number] Ashley Powell is back in today for her a biopsy followup It reveals benign endometrial glands no atypia hyperplasia or malignancy  As a result we can conclude that she is having some estrogenic stimulation however minor it may be I will suppress her with progestational agent every 3 months reevaluate her endometrium and a year and see how her bleeding cycling has done over that period of time Her estrogenic stimulation could be from a flickering ovarian functioning or peripheral fat production

## 2013-06-05 ENCOUNTER — Ambulatory Visit (INDEPENDENT_AMBULATORY_CARE_PROVIDER_SITE_OTHER): Payer: BC Managed Care – PPO | Admitting: Psychiatry

## 2013-06-05 ENCOUNTER — Encounter (HOSPITAL_COMMUNITY): Payer: Self-pay | Admitting: Psychiatry

## 2013-06-05 VITALS — BP 130/82 | Ht 65.0 in | Wt 196.0 lb

## 2013-06-05 DIAGNOSIS — IMO0002 Reserved for concepts with insufficient information to code with codable children: Secondary | ICD-10-CM

## 2013-06-05 DIAGNOSIS — F410 Panic disorder [episodic paroxysmal anxiety] without agoraphobia: Secondary | ICD-10-CM

## 2013-06-05 DIAGNOSIS — F5105 Insomnia due to other mental disorder: Secondary | ICD-10-CM

## 2013-06-05 DIAGNOSIS — F489 Nonpsychotic mental disorder, unspecified: Secondary | ICD-10-CM

## 2013-06-05 DIAGNOSIS — F411 Generalized anxiety disorder: Secondary | ICD-10-CM

## 2013-06-05 DIAGNOSIS — F3130 Bipolar disorder, current episode depressed, mild or moderate severity, unspecified: Secondary | ICD-10-CM

## 2013-06-05 MED ORDER — TRAZODONE HCL 50 MG PO TABS: 50.0000 mg | ORAL_TABLET | Freq: Every day | ORAL | Status: DC

## 2013-06-05 MED ORDER — PROPRANOLOL HCL 10 MG PO TABS: 10.0000 mg | ORAL_TABLET | Freq: Four times a day (QID) | ORAL | Status: DC

## 2013-06-05 MED ORDER — LORAZEPAM 1 MG PO TABS: 1.0000 mg | ORAL_TABLET | Freq: Four times a day (QID) | ORAL | Status: DC | PRN

## 2013-06-05 MED ORDER — DULOXETINE HCL 60 MG PO CPEP: 60.0000 mg | ORAL_CAPSULE | Freq: Every morning | ORAL | Status: DC

## 2013-06-05 MED ORDER — LAMOTRIGINE 25 MG PO TABS: 50.0000 mg | ORAL_TABLET | Freq: Every day | ORAL | Status: DC
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
## 2013-06-25 ENCOUNTER — Other Ambulatory Visit: Payer: Self-pay

## 2013-07-10 ENCOUNTER — Ambulatory Visit (INDEPENDENT_AMBULATORY_CARE_PROVIDER_SITE_OTHER): Payer: Medicare Other | Admitting: Family Medicine

## 2013-07-10 ENCOUNTER — Encounter: Payer: Self-pay | Admitting: Family Medicine

## 2013-07-10 VITALS — BP 120/78 | HR 78 | Temp 97.6°F | Resp 18 | Ht 63.0 in | Wt 194.0 lb

## 2013-07-10 DIAGNOSIS — E559 Vitamin D deficiency, unspecified: Secondary | ICD-10-CM

## 2013-07-10 DIAGNOSIS — E785 Hyperlipidemia, unspecified: Secondary | ICD-10-CM

## 2013-07-10 DIAGNOSIS — Z23 Encounter for immunization: Secondary | ICD-10-CM

## 2013-07-10 DIAGNOSIS — Z720 Tobacco use: Secondary | ICD-10-CM

## 2013-07-10 DIAGNOSIS — J449 Chronic obstructive pulmonary disease, unspecified: Secondary | ICD-10-CM

## 2013-07-10 DIAGNOSIS — IMO0001 Reserved for inherently not codable concepts without codable children: Secondary | ICD-10-CM

## 2013-07-10 DIAGNOSIS — F410 Panic disorder [episodic paroxysmal anxiety] without agoraphobia: Secondary | ICD-10-CM

## 2013-07-10 DIAGNOSIS — F489 Nonpsychotic mental disorder, unspecified: Secondary | ICD-10-CM

## 2013-07-10 DIAGNOSIS — F5105 Insomnia due to other mental disorder: Secondary | ICD-10-CM

## 2013-07-10 DIAGNOSIS — R7303 Prediabetes: Secondary | ICD-10-CM

## 2013-07-10 DIAGNOSIS — F17200 Nicotine dependence, unspecified, uncomplicated: Secondary | ICD-10-CM

## 2013-07-10 DIAGNOSIS — F319 Bipolar disorder, unspecified: Secondary | ICD-10-CM

## 2013-07-10 LAB — CBC WITH DIFFERENTIAL/PLATELET
Basophils Absolute: 0.1 10*3/uL (ref 0.0–0.1)
Basophils Relative: 1 % (ref 0–1)
Eosinophils Absolute: 0.7 10*3/uL (ref 0.0–0.7)
Eosinophils Relative: 6 % — ABNORMAL HIGH (ref 0–5)
HCT: 45.2 % (ref 36.0–46.0)
Lymphocytes Relative: 30 % (ref 12–46)
MCH: 30 pg (ref 26.0–34.0)
MCHC: 34.7 g/dL (ref 30.0–36.0)
Monocytes Absolute: 0.8 10*3/uL (ref 0.1–1.0)
Neutro Abs: 6.2 10*3/uL (ref 1.7–7.7)
Platelets: 227 10*3/uL (ref 150–400)
RDW: 13.4 % (ref 11.5–15.5)
WBC: 11.1 10*3/uL — ABNORMAL HIGH (ref 4.0–10.5)

## 2013-07-10 LAB — LIPID PANEL
Cholesterol: 161 mg/dL (ref 0–200)
LDL Cholesterol: 83 mg/dL (ref 0–99)
Total CHOL/HDL Ratio: 3.9 Ratio
Triglycerides: 184 mg/dL — ABNORMAL HIGH (ref <150)
VLDL: 37 mg/dL (ref 0–40)

## 2013-07-10 LAB — COMPREHENSIVE METABOLIC PANEL
ALT: 14 U/L (ref 0–35)
AST: 16 U/L (ref 0–37)
Alkaline Phosphatase: 95 U/L (ref 39–117)
BUN: 9 mg/dL (ref 6–23)
CO2: 29 mEq/L (ref 19–32)
Calcium: 9.8 mg/dL (ref 8.4–10.5)
Chloride: 100 mEq/L (ref 96–112)
Potassium: 4.8 mEq/L (ref 3.5–5.3)
Sodium: 139 mEq/L (ref 135–145)
Total Bilirubin: 0.4 mg/dL (ref 0.3–1.2)
Total Protein: 7.2 g/dL (ref 6.0–8.3)

## 2013-07-10 LAB — HEMOGLOBIN A1C, FINGERSTICK: Hgb A1C (fingerstick): 6.8 % — ABNORMAL HIGH (ref <5.7)

## 2013-07-10 MED ORDER — PROPRANOLOL HCL 10 MG PO TABS: 10.0000 mg | ORAL_TABLET | Freq: Four times a day (QID) | ORAL | Status: DC

## 2013-07-10 MED ORDER — TRAZODONE HCL 50 MG PO TABS: 50.0000 mg | ORAL_TABLET | Freq: Every day | ORAL | Status: DC

## 2013-07-10 MED ORDER — ALBUTEROL SULFATE HFA 108 (90 BASE) MCG/ACT IN AERS: 2.0000 | INHALATION_SPRAY | RESPIRATORY_TRACT | Status: DC | PRN

## 2013-07-10 MED ORDER — ROSUVASTATIN CALCIUM 10 MG PO TABS: 10.0000 mg | ORAL_TABLET | Freq: Every day | ORAL | Status: DC

## 2013-07-10 MED ORDER — LAMOTRIGINE 25 MG PO TABS: 50.0000 mg | ORAL_TABLET | Freq: Every day | ORAL | Status: DC

## 2013-07-10 MED ORDER — METFORMIN HCL ER 500 MG PO TB24: 500.0000 mg | ORAL_TABLET | Freq: Every day | ORAL | Status: DC

## 2013-07-10 MED ORDER — PANTOPRAZOLE SODIUM 40 MG PO TBEC: 40.0000 mg | DELAYED_RELEASE_TABLET | Freq: Two times a day (BID) | ORAL | Status: DC

## 2013-07-10 MED ORDER — FOLIC ACID 1 MG PO TABS: 1.0000 mg | ORAL_TABLET | Freq: Every day | ORAL | Status: DC

## 2013-07-10 MED ORDER — LORAZEPAM 1 MG PO TABS: 1.0000 mg | ORAL_TABLET | Freq: Four times a day (QID) | ORAL | Status: DC | PRN

## 2013-07-10 MED ORDER — DULOXETINE HCL 60 MG PO CPEP: 60.0000 mg | ORAL_CAPSULE | Freq: Every morning | ORAL | Status: DC

## 2013-07-10 NOTE — Patient Instructions (Addendum)
We will call with lab results and send letter Good luck with the move FLu shot given Continue current medications Start metformin once a day

## 2013-07-11 ENCOUNTER — Encounter: Payer: Self-pay | Admitting: Family Medicine

## 2013-07-11 DIAGNOSIS — IMO0001 Reserved for inherently not codable concepts without codable children: Secondary | ICD-10-CM | POA: Insufficient documentation

## 2013-07-11 NOTE — Assessment & Plan Note (Signed)
He continues to smoke despite her COPD but she is cutting back

## 2013-07-11 NOTE — Assessment & Plan Note (Signed)
Fasting lipid panel will be rechecked today as well as her liver function tests

## 2013-07-11 NOTE — Assessment & Plan Note (Signed)
Her A1c has worsened to 6.8%. I will start her on once daily metformin this can be followup by her new PCP. She will continue the Crestor She will need an ACE inhibitor

## 2013-07-11 NOTE — Progress Notes (Signed)
  Subjective:    Patient ID: Ashley Powell, female    DOB: 1960-09-22, 52 y.o.   MRN: [id number]     HPI Patient here to follow chronic medical problems. She informs me today that she will be relocating to the [place] area with her husband and this will be our last visit. She was being followed by neurology secondary to falls and change in mental status, abnormal involuntary movement, she was found to have nonspecific white matter changes with negative workup for multiple sclerosis. She was also found to have a seizure disorder which is , managed by neurology. She subsequently has been followed by them for epidural injections secondary to chronic back pain.  She was seen by vascular secondary to carotid artery stenosis she is status post endarterectomy she's been an intolerable of statins in the past however her last visit we resume Crestor and she's not had any difficulties.  Prediabetes -  She was found to have elevated blood sugar at a health screening her A1c was done 3 months ago which was 6.5 she is due for repeat A1c. She tells me today she checked her blood sugar on her mother's meter and she's had readings up to 300.  Bipolar, anxiety disorder OCD, panic attacks - he is followed by psychiatry for this. She feels well on her current regimen which does include benzodiazepines as well as antidepressant and mood stabilizers.     Review of Systems  GEN- denies fatigue, fever, weight loss,weakness, recent illness HEENT- denies eye drainage, change in vision, nasal discharge, CVS- denies chest pain, palpitations RESP- denies SOB, cough, wheeze ABD- denies N/V, change in stools, abd pain GU- denies dysuria, hematuria, dribbling, incontinence MSK- +joint pain, muscle aches, injury Neuro- denies headache, dizziness, syncope, seizure activity Endo- Denies polyuria, polydipsia, polyphagia     Objective:   Physical Exam GEN- NAD, alert and oriented x3 HEENT- PERRL, EOMI, non injected  sclera, pink conjunctiva, MMM, oropharynx clear CVS- RRR, no murmur RESP-CTAB ABD-NABS,soft,nt,nd EXT- No edema Pulses- Radial, DP- 2+ Psych- tearful today, not overly anxious, good eye contact, normal speech       Assessment & Plan:

## 2013-07-11 NOTE — Assessment & Plan Note (Signed)
She's not been maintained on inhalers since she establish with the last year. She has albuterol at home but very rarely uses this. She however continues to smoke. He was given her flu shot she did have a mild localized reaction last year however I think this is safe to give this year

## 2013-07-11 NOTE — Assessment & Plan Note (Signed)
Followed by psychiatry. She's currently doing well on her current medication regimen

## 2013-07-14 ENCOUNTER — Encounter: Payer: Self-pay | Admitting: *Deleted

## 2013-08-11 ENCOUNTER — Encounter: Payer: Self-pay | Admitting: Family Medicine

## 2013-08-17 MED ORDER — METFORMIN HCL ER (OSM) 1000 MG PO TB24: 1000.0000 mg | ORAL_TABLET | Freq: Every day | ORAL | Status: DC

## 2013-08-26 ENCOUNTER — Encounter: Payer: Self-pay | Admitting: Family Medicine

## 2013-08-28 ENCOUNTER — Other Ambulatory Visit: Payer: Self-pay | Admitting: Family Medicine

## 2013-08-28 MED ORDER — METFORMIN HCL 500 MG PO TABS: 500.0000 mg | ORAL_TABLET | Freq: Two times a day (BID) | ORAL | Status: DC

## 2013-08-28 MED ORDER — ALBUTEROL SULFATE HFA 108 (90 BASE) MCG/ACT IN AERS: 2.0000 | INHALATION_SPRAY | RESPIRATORY_TRACT | Status: AC | PRN

## 2013-09-04 ENCOUNTER — Ambulatory Visit (HOSPITAL_COMMUNITY): Payer: Self-pay | Admitting: Psychiatry

## 2013-09-07 ENCOUNTER — Encounter: Payer: Self-pay | Admitting: Urgent Care

## 2013-09-07 ENCOUNTER — Encounter: Payer: Self-pay | Admitting: Family

## 2013-09-23 ENCOUNTER — Other Ambulatory Visit: Payer: Self-pay | Admitting: Family

## 2013-09-23 DIAGNOSIS — Z48812 Encounter for surgical aftercare following surgery on the circulatory system: Secondary | ICD-10-CM

## 2013-09-23 DIAGNOSIS — I6529 Occlusion and stenosis of unspecified carotid artery: Secondary | ICD-10-CM

## 2013-10-01 ENCOUNTER — Telehealth: Payer: Self-pay | Admitting: *Deleted

## 2013-10-01 DIAGNOSIS — F410 Panic disorder [episodic paroxysmal anxiety] without agoraphobia: Secondary | ICD-10-CM

## 2013-10-01 MED ORDER — LORAZEPAM 1 MG PO TABS: 1.0000 mg | ORAL_TABLET | Freq: Four times a day (QID) | ORAL | Status: DC | PRN

## 2013-10-01 NOTE — Telephone Encounter (Signed)
[employer] sent letter stating that they have sent a temporary supply of Lorazepam 1mg  pt insurance will not cover for the quanity, pts meds have been changed to 3/day for #90 instead of #120, contacted pt left message to return my call.Script has been called in for #90.

## 2013-11-03 ENCOUNTER — Ambulatory Visit: Payer: Self-pay | Admitting: Family

## 2013-11-03 ENCOUNTER — Other Ambulatory Visit (HOSPITAL_COMMUNITY): Payer: Self-pay

## 2014-02-08 ENCOUNTER — Telehealth: Payer: Self-pay | Admitting: *Deleted

## 2014-02-08 DIAGNOSIS — F5105 Insomnia due to other mental disorder: Secondary | ICD-10-CM

## 2014-02-08 NOTE — Telephone Encounter (Signed)
Okay to refill? 

## 2014-02-08 NOTE — Telephone Encounter (Signed)
Received medication refill request for Trazodone.   Last office visit/ refill 07/10/2013.  Patient moved to [place] after last visit.   MD please advise.

## 2014-02-09 ENCOUNTER — Other Ambulatory Visit: Payer: Self-pay | Admitting: *Deleted

## 2014-02-09 DIAGNOSIS — F5105 Insomnia due to other mental disorder: Secondary | ICD-10-CM

## 2014-02-09 MED ORDER — TRAZODONE HCL 50 MG PO TABS: 50.0000 mg | ORAL_TABLET | Freq: Every day | ORAL | Status: DC

## 2014-02-09 NOTE — Telephone Encounter (Signed)
Prescription sent to pharmacy.

## 2014-03-03 ENCOUNTER — Ambulatory Visit (INDEPENDENT_AMBULATORY_CARE_PROVIDER_SITE_OTHER): Payer: Medicare Other | Admitting: Psychiatry

## 2014-03-03 ENCOUNTER — Encounter (HOSPITAL_COMMUNITY): Payer: Self-pay | Admitting: Psychiatry

## 2014-03-03 VITALS — BP 120/68 | Ht 63.0 in | Wt 179.0 lb

## 2014-03-03 DIAGNOSIS — F410 Panic disorder [episodic paroxysmal anxiety] without agoraphobia: Secondary | ICD-10-CM

## 2014-03-03 DIAGNOSIS — F329 Major depressive disorder, single episode, unspecified: Secondary | ICD-10-CM

## 2014-03-03 DIAGNOSIS — F489 Nonpsychotic mental disorder, unspecified: Secondary | ICD-10-CM

## 2014-03-03 DIAGNOSIS — F5105 Insomnia due to other mental disorder: Secondary | ICD-10-CM

## 2014-03-03 DIAGNOSIS — I6350 Cerebral infarction due to unspecified occlusion or stenosis of unspecified cerebral artery: Secondary | ICD-10-CM

## 2014-03-03 DIAGNOSIS — F3289 Other specified depressive episodes: Secondary | ICD-10-CM

## 2014-03-03 DIAGNOSIS — IMO0002 Reserved for concepts with insufficient information to code with codable children: Secondary | ICD-10-CM

## 2014-03-03 MED ORDER — DULOXETINE HCL 60 MG PO CPEP: 60.0000 mg | ORAL_CAPSULE | Freq: Two times a day (BID) | ORAL | Status: DC

## 2014-03-03 MED ORDER — TRAZODONE HCL 50 MG PO TABS: 50.0000 mg | ORAL_TABLET | Freq: Every day | ORAL | Status: DC

## 2014-03-03 MED ORDER — LAMOTRIGINE 25 MG PO TABS: 50.0000 mg | ORAL_TABLET | Freq: Every day | ORAL | Status: DC

## 2014-03-03 MED ORDER — ALPRAZOLAM 1 MG PO TABS: 1.0000 mg | ORAL_TABLET | Freq: Three times a day (TID) | ORAL | Status: DC
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
## 2014-03-29 ENCOUNTER — Telehealth (HOSPITAL_COMMUNITY): Payer: Self-pay | Admitting: *Deleted

## 2014-03-29 NOTE — Telephone Encounter (Signed)
pt calling stating that her Xanax in not working for her. Its making her too sleepy and making her paranoid and manic. Pt would like to go back on Lorazepam. [phone].

## 2014-03-30 ENCOUNTER — Other Ambulatory Visit (HOSPITAL_COMMUNITY): Payer: Self-pay | Admitting: Psychiatry

## 2014-03-30 NOTE — Telephone Encounter (Signed)
Ativan 1 mg #90, 2 rfs called in

## 2014-05-17 ENCOUNTER — Encounter (HOSPITAL_COMMUNITY): Payer: Self-pay | Admitting: Psychiatry

## 2014-05-17 ENCOUNTER — Ambulatory Visit (INDEPENDENT_AMBULATORY_CARE_PROVIDER_SITE_OTHER): Payer: Medicare Other | Admitting: Psychiatry

## 2014-05-17 VITALS — BP 132/85 | HR 109 | Ht 63.0 in | Wt 179.6 lb

## 2014-05-17 DIAGNOSIS — F3130 Bipolar disorder, current episode depressed, mild or moderate severity, unspecified: Secondary | ICD-10-CM

## 2014-05-17 DIAGNOSIS — F410 Panic disorder [episodic paroxysmal anxiety] without agoraphobia: Secondary | ICD-10-CM

## 2014-05-17 DIAGNOSIS — F489 Nonpsychotic mental disorder, unspecified: Secondary | ICD-10-CM

## 2014-05-17 DIAGNOSIS — IMO0002 Reserved for concepts with insufficient information to code with codable children: Secondary | ICD-10-CM

## 2014-05-17 DIAGNOSIS — F411 Generalized anxiety disorder: Secondary | ICD-10-CM

## 2014-05-17 DIAGNOSIS — F5105 Insomnia due to other mental disorder: Secondary | ICD-10-CM

## 2014-05-17 MED ORDER — TRAZODONE HCL 50 MG PO TABS: 50.0000 mg | ORAL_TABLET | Freq: Every day | ORAL | Status: DC

## 2014-05-17 MED ORDER — DULOXETINE HCL 60 MG PO CPEP: 60.0000 mg | ORAL_CAPSULE | Freq: Two times a day (BID) | ORAL | Status: DC

## 2014-05-17 MED ORDER — LORAZEPAM 1 MG PO TABS
1.0000 mg | ORAL_TABLET | Freq: Three times a day (TID) | ORAL | Status: DC
Start: 2014-05-17 — End: 2014-08-02

## 2014-05-17 MED ORDER — LAMOTRIGINE 25 MG PO TABS: 50.0000 mg | ORAL_TABLET | Freq: Every day | ORAL | Status: DC
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
## 2014-05-29 ENCOUNTER — Other Ambulatory Visit: Payer: Self-pay | Admitting: *Deleted

## 2014-05-29 MED ORDER — PANTOPRAZOLE SODIUM 40 MG PO TBEC: 40.0000 mg | DELAYED_RELEASE_TABLET | Freq: Two times a day (BID) | ORAL | Status: AC

## 2014-05-29 NOTE — Telephone Encounter (Signed)
Medication filled x1 with no refills.   Requires office visit before any further refills can be given.  

## 2014-06-03 ENCOUNTER — Ambulatory Visit (HOSPITAL_COMMUNITY): Payer: Self-pay | Admitting: Psychiatry

## 2014-06-25 ENCOUNTER — Other Ambulatory Visit: Payer: Self-pay | Admitting: *Deleted

## 2014-06-29 ENCOUNTER — Encounter: Payer: Self-pay | Admitting: *Deleted

## 2014-08-02 ENCOUNTER — Encounter (HOSPITAL_COMMUNITY): Payer: Self-pay | Admitting: Psychiatry

## 2014-08-02 ENCOUNTER — Ambulatory Visit (INDEPENDENT_AMBULATORY_CARE_PROVIDER_SITE_OTHER): Payer: Medicare Other | Admitting: Psychiatry

## 2014-08-02 VITALS — BP 120/82 | HR 98 | Ht 63.0 in | Wt 186.2 lb

## 2014-08-02 DIAGNOSIS — F5105 Insomnia due to other mental disorder: Secondary | ICD-10-CM

## 2014-08-02 DIAGNOSIS — F3130 Bipolar disorder, current episode depressed, mild or moderate severity, unspecified: Secondary | ICD-10-CM

## 2014-08-02 DIAGNOSIS — F410 Panic disorder [episodic paroxysmal anxiety] without agoraphobia: Secondary | ICD-10-CM

## 2014-08-02 DIAGNOSIS — IMO0002 Reserved for concepts with insufficient information to code with codable children: Secondary | ICD-10-CM

## 2014-08-02 DIAGNOSIS — G4700 Insomnia, unspecified: Secondary | ICD-10-CM

## 2014-08-02 DIAGNOSIS — F411 Generalized anxiety disorder: Secondary | ICD-10-CM

## 2014-08-02 MED ORDER — DULOXETINE HCL 60 MG PO CPEP: 60.0000 mg | ORAL_CAPSULE | Freq: Two times a day (BID) | ORAL | Status: DC

## 2014-08-02 MED ORDER — LAMOTRIGINE 25 MG PO TABS: 50.0000 mg | ORAL_TABLET | Freq: Every day | ORAL | Status: DC

## 2014-08-02 MED ORDER — LORAZEPAM 1 MG PO TABS: 1.0000 mg | ORAL_TABLET | Freq: Three times a day (TID) | ORAL | Status: DC

## 2014-08-02 MED ORDER — TRAZODONE HCL 50 MG PO TABS: 50.0000 mg | ORAL_TABLET | Freq: Every day | ORAL | Status: DC
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
## 2014-08-05 ENCOUNTER — Encounter: Payer: Self-pay | Admitting: Family Medicine

## 2014-08-06 ENCOUNTER — Ambulatory Visit (HOSPITAL_COMMUNITY): Payer: Self-pay | Admitting: Psychiatry

## 2014-08-17 ENCOUNTER — Ambulatory Visit (HOSPITAL_COMMUNITY): Payer: Self-pay | Admitting: Psychiatry

## 2014-12-02 ENCOUNTER — Encounter (HOSPITAL_COMMUNITY): Payer: Self-pay | Admitting: Psychiatry

## 2014-12-02 ENCOUNTER — Ambulatory Visit (INDEPENDENT_AMBULATORY_CARE_PROVIDER_SITE_OTHER): Payer: 59 | Admitting: Psychiatry

## 2014-12-02 VITALS — BP 127/90 | HR 111 | Ht 64.25 in | Wt 176.2 lb

## 2014-12-02 DIAGNOSIS — F5105 Insomnia due to other mental disorder: Secondary | ICD-10-CM | POA: Diagnosis not present

## 2014-12-02 DIAGNOSIS — F411 Generalized anxiety disorder: Secondary | ICD-10-CM

## 2014-12-02 DIAGNOSIS — F3130 Bipolar disorder, current episode depressed, mild or moderate severity, unspecified: Secondary | ICD-10-CM

## 2014-12-02 DIAGNOSIS — F410 Panic disorder [episodic paroxysmal anxiety] without agoraphobia: Secondary | ICD-10-CM | POA: Diagnosis not present

## 2014-12-02 DIAGNOSIS — IMO0002 Reserved for concepts with insufficient information to code with codable children: Secondary | ICD-10-CM

## 2014-12-02 MED ORDER — DIAZEPAM 5 MG PO TABS: 5.0000 mg | ORAL_TABLET | Freq: Three times a day (TID) | ORAL | Status: DC

## 2014-12-02 MED ORDER — DULOXETINE HCL 60 MG PO CPEP: 60.0000 mg | ORAL_CAPSULE | Freq: Two times a day (BID) | ORAL | Status: DC

## 2014-12-02 MED ORDER — LAMOTRIGINE 25 MG PO TABS: 50.0000 mg | ORAL_TABLET | Freq: Every day | ORAL | Status: DC
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
## 2014-12-11 IMAGING — CR DG SKULL COMPLETE 4+V
4 series · 4 of 4 positions shown · non-contrast
Comparison: Brain MRI, 01/15/2012

CLINICAL DATA: Frontal region pain for 1 day.   Reported
indentation in the middle forehead several days ago.

SKULL - COMPLETE 4 + VIEW

[view not recorded (1 of 4)]
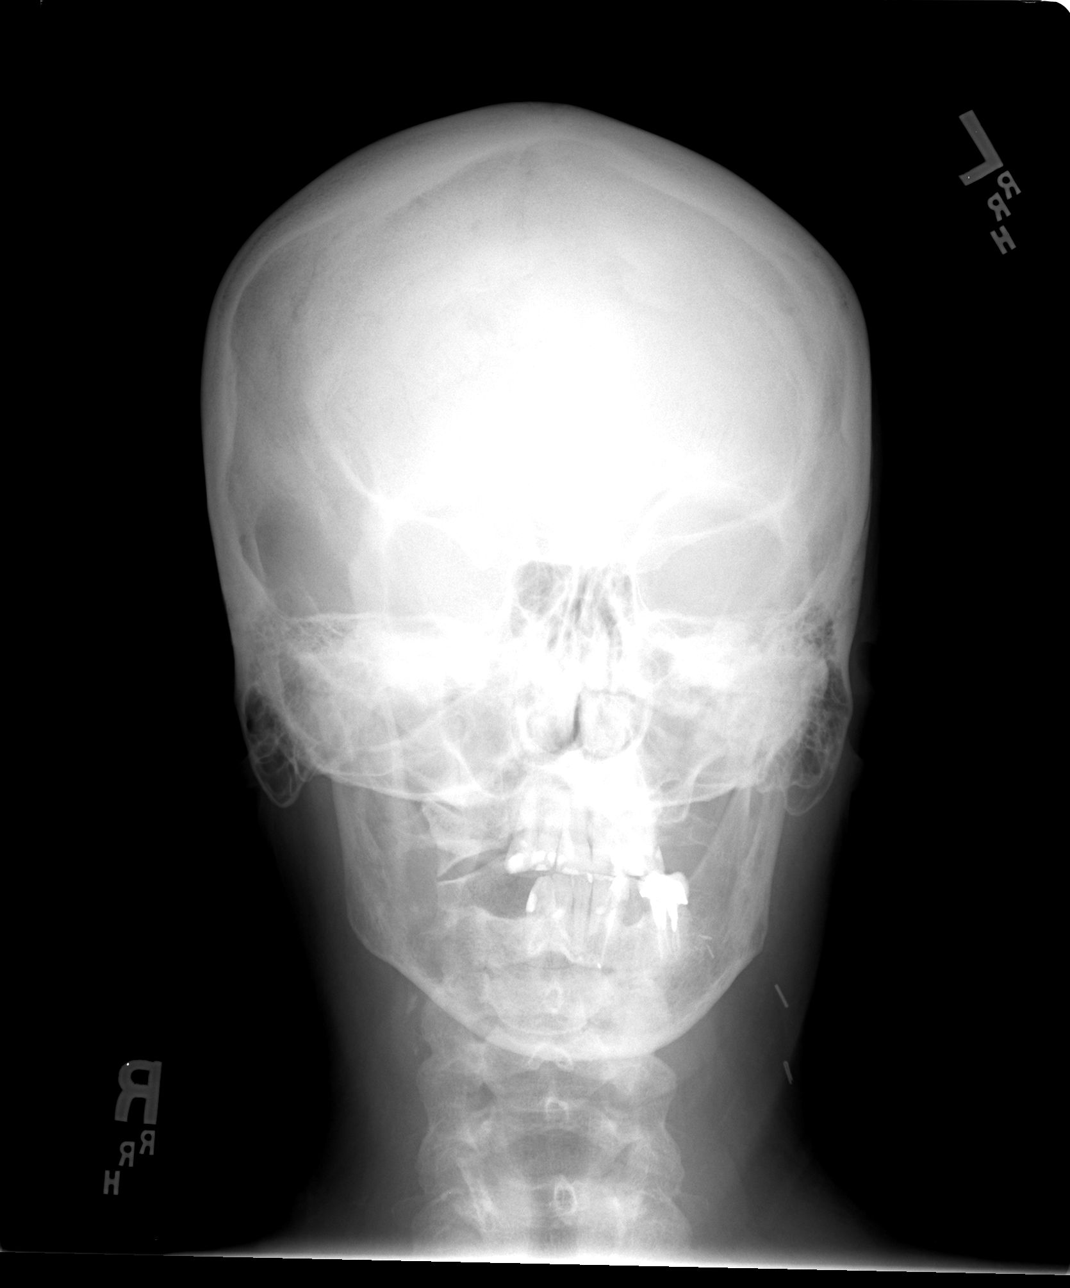

[view not recorded (2 of 4)]
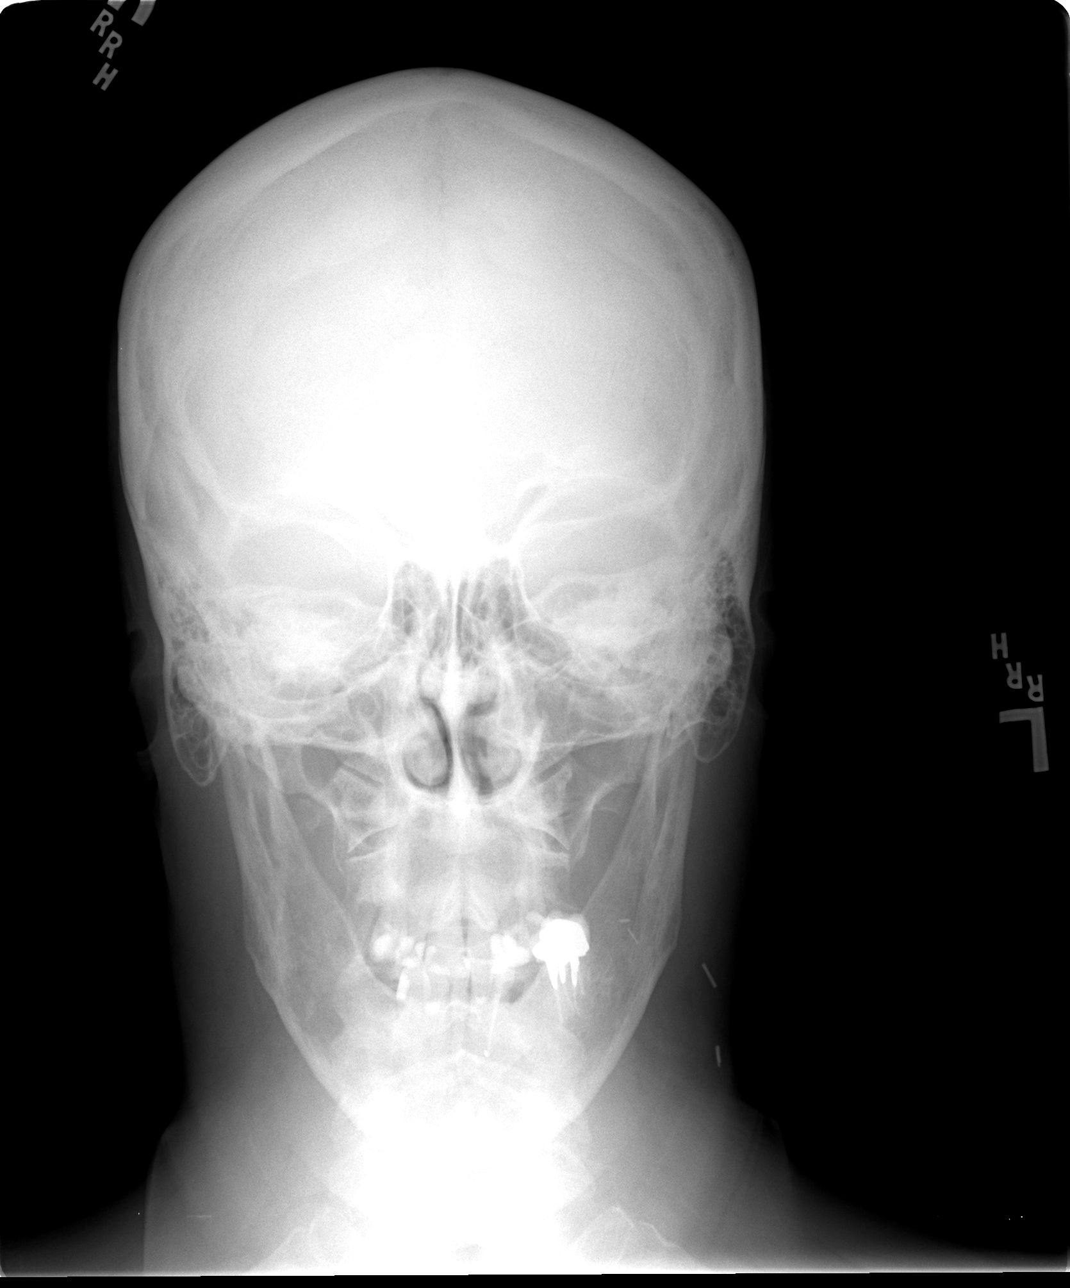

[view not recorded (3 of 4)]
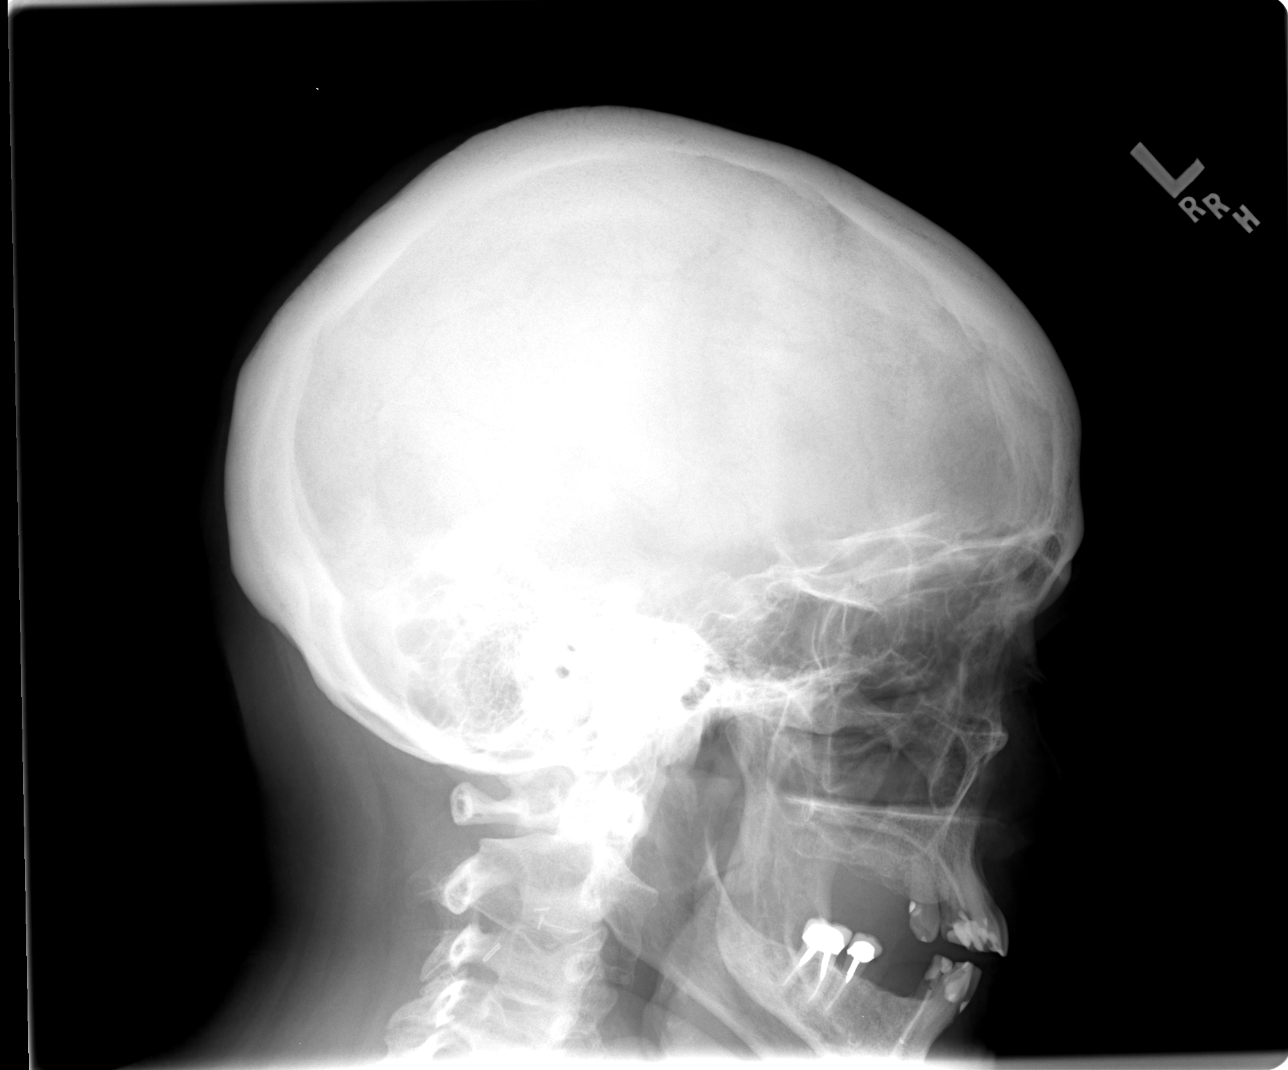

[view not recorded (4 of 4)]
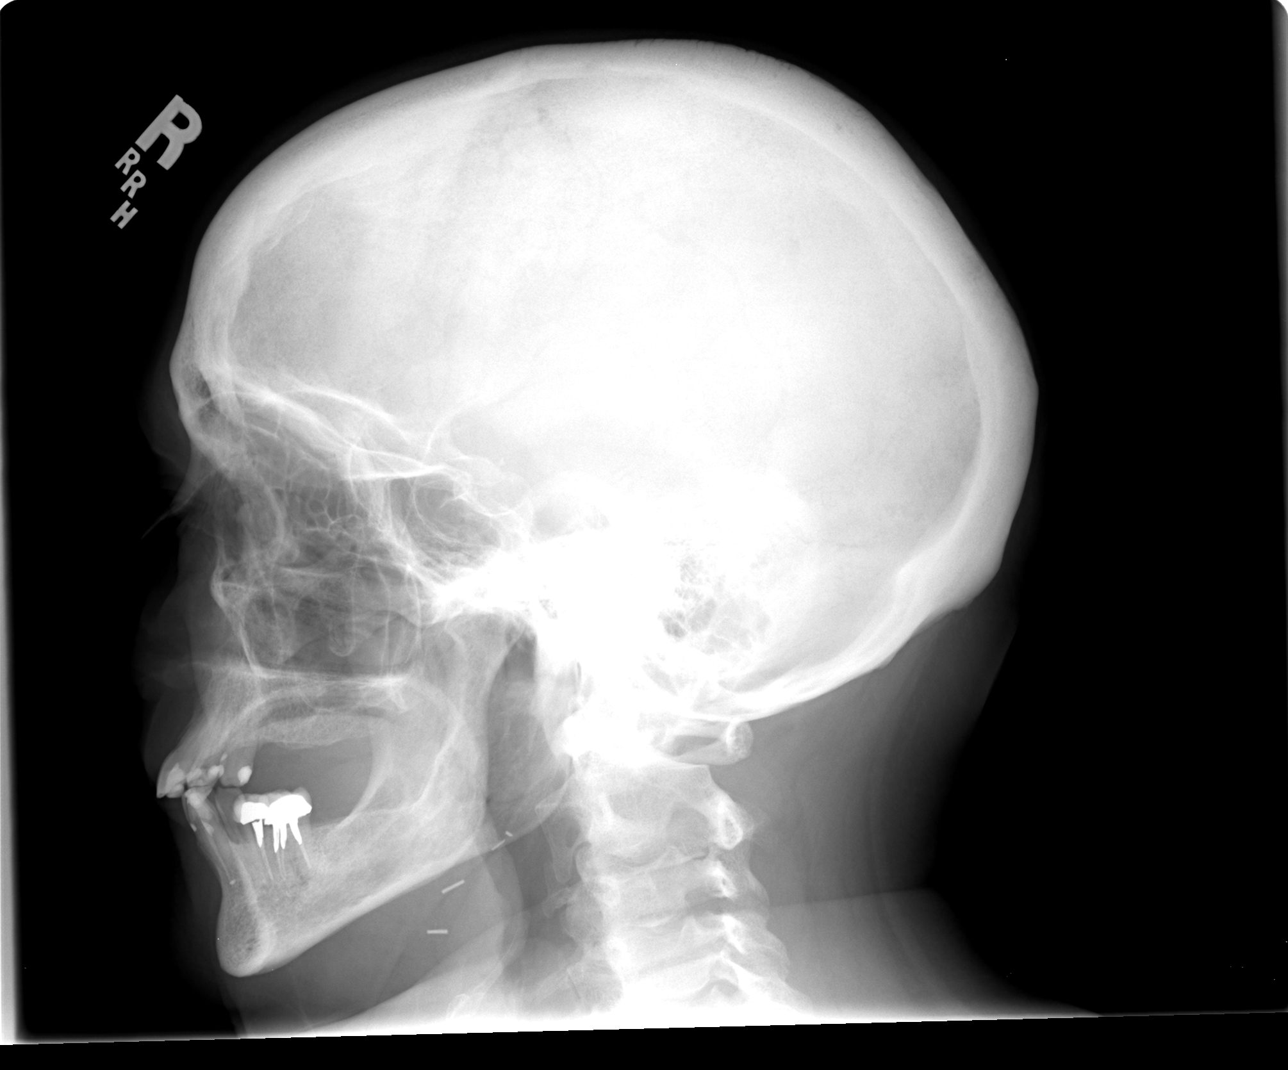

[4 of 4 positions shown; findings below may reference images not displayed]

FINDINGS: There is no fracture or bone lesion.

The sinuses are clear.  The soft tissues are unremarkable.
IMPRESSION: Normal skull radiographs.

## 2015-02-01 ENCOUNTER — Ambulatory Visit (HOSPITAL_COMMUNITY): Payer: Self-pay | Admitting: Psychiatry

## 2015-02-17 ENCOUNTER — Ambulatory Visit (HOSPITAL_COMMUNITY): Payer: Self-pay | Admitting: Psychiatry

## 2015-02-22 ENCOUNTER — Ambulatory Visit (INDEPENDENT_AMBULATORY_CARE_PROVIDER_SITE_OTHER): Payer: 59 | Admitting: Psychiatry

## 2015-02-22 ENCOUNTER — Encounter (HOSPITAL_COMMUNITY): Payer: Self-pay | Admitting: Psychiatry

## 2015-02-22 VITALS — BP 120/75 | HR 84 | Ht 64.25 in | Wt 167.4 lb

## 2015-02-22 DIAGNOSIS — F411 Generalized anxiety disorder: Secondary | ICD-10-CM | POA: Diagnosis not present

## 2015-02-22 DIAGNOSIS — IMO0002 Reserved for concepts with insufficient information to code with codable children: Secondary | ICD-10-CM

## 2015-02-22 DIAGNOSIS — F5105 Insomnia due to other mental disorder: Secondary | ICD-10-CM | POA: Diagnosis not present

## 2015-02-22 DIAGNOSIS — F410 Panic disorder [episodic paroxysmal anxiety] without agoraphobia: Secondary | ICD-10-CM | POA: Diagnosis not present

## 2015-02-22 DIAGNOSIS — F3130 Bipolar disorder, current episode depressed, mild or moderate severity, unspecified: Secondary | ICD-10-CM | POA: Diagnosis not present

## 2015-02-22 MED ORDER — TRAZODONE HCL 50 MG PO TABS: 50.0000 mg | ORAL_TABLET | Freq: Every day | ORAL | Status: DC

## 2015-02-22 MED ORDER — LAMOTRIGINE 25 MG PO TABS: 50.0000 mg | ORAL_TABLET | Freq: Every day | ORAL | Status: DC

## 2015-02-22 MED ORDER — DIAZEPAM 5 MG PO TABS: 5.0000 mg | ORAL_TABLET | Freq: Three times a day (TID) | ORAL | Status: DC

## 2015-02-22 MED ORDER — DULOXETINE HCL 60 MG PO CPEP: 60.0000 mg | ORAL_CAPSULE | Freq: Two times a day (BID) | ORAL | Status: DC
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
## 2015-05-25 ENCOUNTER — Encounter (HOSPITAL_COMMUNITY): Payer: Self-pay | Admitting: Psychiatry

## 2015-05-25 ENCOUNTER — Telehealth (HOSPITAL_COMMUNITY): Payer: Self-pay | Admitting: *Deleted

## 2015-05-25 ENCOUNTER — Ambulatory Visit (INDEPENDENT_AMBULATORY_CARE_PROVIDER_SITE_OTHER): Payer: 59 | Admitting: Psychiatry

## 2015-05-25 VITALS — BP 108/65 | HR 84 | Ht 64.25 in | Wt 164.2 lb

## 2015-05-25 DIAGNOSIS — F411 Generalized anxiety disorder: Secondary | ICD-10-CM | POA: Diagnosis not present

## 2015-05-25 DIAGNOSIS — IMO0002 Reserved for concepts with insufficient information to code with codable children: Secondary | ICD-10-CM

## 2015-05-25 DIAGNOSIS — F3130 Bipolar disorder, current episode depressed, mild or moderate severity, unspecified: Secondary | ICD-10-CM | POA: Diagnosis not present

## 2015-05-25 DIAGNOSIS — F5105 Insomnia due to other mental disorder: Secondary | ICD-10-CM | POA: Diagnosis not present

## 2015-05-25 DIAGNOSIS — F410 Panic disorder [episodic paroxysmal anxiety] without agoraphobia: Secondary | ICD-10-CM

## 2015-05-25 MED ORDER — LAMOTRIGINE 25 MG PO TABS: ORAL_TABLET | ORAL | Status: DC

## 2015-05-25 MED ORDER — DIAZEPAM 5 MG PO TABS: 5.0000 mg | ORAL_TABLET | Freq: Three times a day (TID) | ORAL | Status: DC

## 2015-05-25 MED ORDER — TRAZODONE HCL 50 MG PO TABS
50.0000 mg | ORAL_TABLET | Freq: Every day | ORAL | Status: DC
Start: 2015-05-25 — End: 2015-09-19

## 2015-05-25 MED ORDER — LAMOTRIGINE 25 MG PO TABS: 50.0000 mg | ORAL_TABLET | Freq: Every day | ORAL | Status: DC

## 2015-05-25 MED ORDER — DULOXETINE HCL 60 MG PO CPEP: 60.0000 mg | ORAL_CAPSULE | Freq: Two times a day (BID) | ORAL | Status: DC

## 2015-05-25 NOTE — Telephone Encounter (Signed)
Pt pharmacy called to inquire about pt Lamictal. Per pharmacist, they received 2 scripts for pt. One for 60 mg and another for TID. Informed pharmacist that per pt chart, pt is suppose to be taking 75 mg a day due to what was writen by Dr. Harrington Challenger. Pharmacist showed understanding

## 2015-05-25 NOTE — Telephone Encounter (Signed)
RECEIVED TWO DIFFERENT SCRIPTS FOR LAMICTAL 25 MG. ONE FOR 61 AND 2ND IS FOR 89 THREE TABLETS EVERY MORNING.
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
## 2015-05-26 NOTE — Telephone Encounter (Signed)
Spoke with pt pharmacy and informed them per pt chart she is suppose to be on 75 mg and pharmacist showed understanding

## 2015-08-25 ENCOUNTER — Ambulatory Visit (HOSPITAL_COMMUNITY): Payer: Self-pay | Admitting: Psychiatry

## 2015-09-19 ENCOUNTER — Encounter (HOSPITAL_COMMUNITY): Payer: Self-pay | Admitting: Psychiatry

## 2015-09-19 ENCOUNTER — Ambulatory Visit (INDEPENDENT_AMBULATORY_CARE_PROVIDER_SITE_OTHER): Payer: 59 | Admitting: Psychiatry

## 2015-09-19 VITALS — BP 122/80 | HR 107 | Ht 64.25 in | Wt 159.8 lb

## 2015-09-19 DIAGNOSIS — F5105 Insomnia due to other mental disorder: Secondary | ICD-10-CM

## 2015-09-19 DIAGNOSIS — F410 Panic disorder [episodic paroxysmal anxiety] without agoraphobia: Secondary | ICD-10-CM

## 2015-09-19 DIAGNOSIS — F3130 Bipolar disorder, current episode depressed, mild or moderate severity, unspecified: Secondary | ICD-10-CM | POA: Diagnosis not present

## 2015-09-19 DIAGNOSIS — G4700 Insomnia, unspecified: Secondary | ICD-10-CM | POA: Diagnosis not present

## 2015-09-19 DIAGNOSIS — F411 Generalized anxiety disorder: Secondary | ICD-10-CM | POA: Diagnosis not present

## 2015-09-19 DIAGNOSIS — IMO0002 Reserved for concepts with insufficient information to code with codable children: Secondary | ICD-10-CM

## 2015-09-19 MED ORDER — TRAZODONE HCL 50 MG PO TABS: 50.0000 mg | ORAL_TABLET | Freq: Every day | ORAL | Status: DC

## 2015-09-19 MED ORDER — DULOXETINE HCL 60 MG PO CPEP: 60.0000 mg | ORAL_CAPSULE | Freq: Two times a day (BID) | ORAL | Status: DC

## 2015-09-19 MED ORDER — DIAZEPAM 5 MG PO TABS: 5.0000 mg | ORAL_TABLET | Freq: Three times a day (TID) | ORAL | Status: DC

## 2015-09-19 MED ORDER — LAMOTRIGINE 25 MG PO TABS: ORAL_TABLET | ORAL | Status: DC
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
## 2016-02-03 ENCOUNTER — Other Ambulatory Visit (HOSPITAL_COMMUNITY): Payer: Self-pay | Admitting: Psychiatry

## 2016-02-06 ENCOUNTER — Telehealth (HOSPITAL_COMMUNITY): Payer: Self-pay | Admitting: *Deleted

## 2016-02-06 ENCOUNTER — Other Ambulatory Visit (HOSPITAL_COMMUNITY): Payer: Self-pay | Admitting: Psychiatry

## 2016-02-06 MED ORDER — LAMOTRIGINE 25 MG PO TABS: ORAL_TABLET | ORAL | Status: DC

## 2016-02-06 NOTE — Telephone Encounter (Signed)
noted 

## 2016-02-06 NOTE — Telephone Encounter (Signed)
done

## 2016-02-06 NOTE — Telephone Encounter (Signed)
Pt pharmacy requesting refills for pt Lamictal 25 mg 3 tablets QAM. Pt medication last filled 09-19-2015 with 3 refills and was instructed to return for f/u in 6 mth. Pt f/u appt is 03-16-2016. Per pt pharmacy, pt is out of refills.

## 2016-03-16 ENCOUNTER — Ambulatory Visit (INDEPENDENT_AMBULATORY_CARE_PROVIDER_SITE_OTHER): Payer: 59 | Admitting: Psychiatry

## 2016-03-16 ENCOUNTER — Encounter (HOSPITAL_COMMUNITY): Payer: Self-pay | Admitting: Psychiatry

## 2016-03-16 ENCOUNTER — Ambulatory Visit (HOSPITAL_COMMUNITY): Payer: Self-pay | Admitting: Psychiatry

## 2016-03-16 VITALS — BP 139/88 | HR 67 | Ht 64.25 in | Wt 166.6 lb

## 2016-03-16 DIAGNOSIS — F5105 Insomnia due to other mental disorder: Secondary | ICD-10-CM

## 2016-03-16 DIAGNOSIS — IMO0002 Reserved for concepts with insufficient information to code with codable children: Secondary | ICD-10-CM

## 2016-03-16 DIAGNOSIS — F489 Nonpsychotic mental disorder, unspecified: Secondary | ICD-10-CM

## 2016-03-16 DIAGNOSIS — I639 Cerebral infarction, unspecified: Secondary | ICD-10-CM

## 2016-03-16 DIAGNOSIS — F0631 Mood disorder due to known physiological condition with depressive features: Secondary | ICD-10-CM

## 2016-03-16 MED ORDER — DIAZEPAM 5 MG PO TABS: 5.0000 mg | ORAL_TABLET | Freq: Three times a day (TID) | ORAL | 5 refills | Status: DC

## 2016-03-16 MED ORDER — DULOXETINE HCL 60 MG PO CPEP: 60.0000 mg | ORAL_CAPSULE | Freq: Two times a day (BID) | ORAL | 5 refills | Status: DC

## 2016-03-16 MED ORDER — TRAZODONE HCL 50 MG PO TABS: 100.0000 mg | ORAL_TABLET | Freq: Every day | ORAL | 5 refills | Status: DC

## 2016-03-16 MED ORDER — LAMOTRIGINE 25 MG PO TABS: ORAL_TABLET | ORAL | 5 refills | Status: DC
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
## 2016-09-06 ENCOUNTER — Other Ambulatory Visit (HOSPITAL_COMMUNITY): Payer: Self-pay | Admitting: Psychiatry

## 2016-09-06 DIAGNOSIS — F5105 Insomnia due to other mental disorder: Secondary | ICD-10-CM

## 2016-09-10 ENCOUNTER — Telehealth (HOSPITAL_COMMUNITY): Payer: Self-pay | Admitting: *Deleted

## 2016-09-10 NOTE — Telephone Encounter (Signed)
spoke with patient regarding appointment.

## 2016-09-11 ENCOUNTER — Telehealth: Payer: Self-pay

## 2016-09-11 NOTE — Telephone Encounter (Signed)
Called patient back after receiving vm. Patient needs to have a referral to schedule a new appt.

## 2016-09-17 ENCOUNTER — Ambulatory Visit (HOSPITAL_COMMUNITY): Payer: Self-pay | Admitting: Psychiatry

## 2016-09-19 ENCOUNTER — Telehealth (HOSPITAL_COMMUNITY): Payer: Self-pay | Admitting: *Deleted

## 2016-09-19 ENCOUNTER — Other Ambulatory Visit (HOSPITAL_COMMUNITY): Payer: Self-pay | Admitting: Psychiatry

## 2016-09-19 DIAGNOSIS — F5105 Insomnia due to other mental disorder: Secondary | ICD-10-CM

## 2016-09-19 MED ORDER — LAMOTRIGINE 25 MG PO TABS: ORAL_TABLET | ORAL | 2 refills | Status: DC

## 2016-09-19 MED ORDER — DULOXETINE HCL 60 MG PO CPEP: 60.0000 mg | ORAL_CAPSULE | Freq: Two times a day (BID) | ORAL | 2 refills | Status: DC

## 2016-09-19 MED ORDER — TRAZODONE HCL 50 MG PO TABS: 100.0000 mg | ORAL_TABLET | Freq: Every day | ORAL | 2 refills | Status: DC

## 2016-09-19 NOTE — Telephone Encounter (Signed)
Pt pharmacy requesting quantity of 180 which will be a 90 days supply for pt Trazodone and Cymbalta. Pt medication was last filled on 03-16-2016. Pt was last seen 03-16-2016 and was to f/u on 09-17-2016 but pt cancelled her appt. Pt did resch her appt. Pt f/u appt is now 09-24-2016. Pt pharmacy number is [phone].

## 2016-09-19 NOTE — Telephone Encounter (Signed)
sent 

## 2016-09-19 NOTE — Telephone Encounter (Signed)
Pt pharmacy requesting quantity of 270 which will be a 90 days supply for pt Lamictal TID. Pt medication was last filled on 03-16-2016 with 90 tabs 5 refills. Pt was last seen 03-16-2016 and was to f/u on 09-17-2016 but pt cancelled her appt. Pt did resch her appt. Pt f/u appt is now 09-24-2016. Pt pharmacy number is [phone].

## 2016-09-19 NOTE — Telephone Encounter (Signed)
Pt pharmacy requesting quantity of 180 which will be a 90 days supply for pt Trazodone and Cymbalta. Pt medication was last filled on 03-16-2016. Pt was last seen 03-16-2016 and was to f/u on 09-17-2016 but pt cancelled her appt. Pt did resch her appt. Pt f/u appt is now 09-24-2016. Pt pharmacy number is [phone].

## 2016-09-19 NOTE — Telephone Encounter (Signed)
noted 

## 2016-09-19 NOTE — Telephone Encounter (Signed)
done

## 2016-09-20 ENCOUNTER — Other Ambulatory Visit (HOSPITAL_COMMUNITY): Payer: Self-pay | Admitting: Psychiatry

## 2016-09-24 ENCOUNTER — Ambulatory Visit (INDEPENDENT_AMBULATORY_CARE_PROVIDER_SITE_OTHER): Payer: 59 | Admitting: Psychiatry

## 2016-09-24 ENCOUNTER — Encounter (HOSPITAL_COMMUNITY): Payer: Self-pay | Admitting: Psychiatry

## 2016-09-24 VITALS — BP 130/74 | HR 86 | Ht 65.0 in | Wt 164.0 lb

## 2016-09-24 DIAGNOSIS — I639 Cerebral infarction, unspecified: Secondary | ICD-10-CM

## 2016-09-24 DIAGNOSIS — F3130 Bipolar disorder, current episode depressed, mild or moderate severity, unspecified: Secondary | ICD-10-CM

## 2016-09-24 DIAGNOSIS — Z91040 Latex allergy status: Secondary | ICD-10-CM

## 2016-09-24 DIAGNOSIS — Z9049 Acquired absence of other specified parts of digestive tract: Secondary | ICD-10-CM

## 2016-09-24 DIAGNOSIS — Z9889 Other specified postprocedural states: Secondary | ICD-10-CM

## 2016-09-24 DIAGNOSIS — F5105 Insomnia due to other mental disorder: Secondary | ICD-10-CM | POA: Diagnosis not present

## 2016-09-24 DIAGNOSIS — IMO0002 Reserved for concepts with insufficient information to code with codable children: Secondary | ICD-10-CM

## 2016-09-24 DIAGNOSIS — F410 Panic disorder [episodic paroxysmal anxiety] without agoraphobia: Secondary | ICD-10-CM

## 2016-09-24 DIAGNOSIS — F411 Generalized anxiety disorder: Secondary | ICD-10-CM

## 2016-09-24 DIAGNOSIS — Z9851 Tubal ligation status: Secondary | ICD-10-CM

## 2016-09-24 DIAGNOSIS — F0631 Mood disorder due to known physiological condition with depressive features: Secondary | ICD-10-CM

## 2016-09-24 DIAGNOSIS — Z888 Allergy status to other drugs, medicaments and biological substances status: Secondary | ICD-10-CM

## 2016-09-24 MED ORDER — DIAZEPAM 5 MG PO TABS: 5.0000 mg | ORAL_TABLET | Freq: Four times a day (QID) | ORAL | 5 refills | Status: DC

## 2016-09-24 MED ORDER — TRAZODONE HCL 50 MG PO TABS: 100.0000 mg | ORAL_TABLET | Freq: Every day | ORAL | 2 refills | Status: DC

## 2016-09-24 MED ORDER — LAMOTRIGINE 25 MG PO TABS: ORAL_TABLET | ORAL | 2 refills | Status: DC

## 2016-09-24 MED ORDER — DULOXETINE HCL 60 MG PO CPEP: 60.0000 mg | ORAL_CAPSULE | Freq: Two times a day (BID) | ORAL | 2 refills | Status: DC
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
## 2016-11-06 ENCOUNTER — Encounter (HOSPITAL_COMMUNITY): Payer: Self-pay

## 2016-11-06 ENCOUNTER — Encounter: Payer: Self-pay | Admitting: Vascular Surgery

## 2016-12-10 ENCOUNTER — Telehealth (HOSPITAL_COMMUNITY): Payer: Self-pay | Admitting: *Deleted

## 2016-12-10 NOTE — Telephone Encounter (Signed)
left voice message, provider not available 12/20/16.  Please call to let us know if arrival time for 2:00 will work for you.

## 2016-12-17 ENCOUNTER — Telehealth (HOSPITAL_COMMUNITY): Payer: Self-pay | Admitting: *Deleted

## 2016-12-17 NOTE — Telephone Encounter (Signed)
Will forward the message to Dr. Harrington Challenger, who is her doctor.

## 2016-12-17 NOTE — Telephone Encounter (Signed)
Where does she want them sent?

## 2016-12-17 NOTE — Telephone Encounter (Signed)
patient left voice message, said her mother had a medical emergency and she is in [place].   She said she need her medications.

## 2016-12-17 NOTE — Telephone Encounter (Signed)
Called pt and would appt was made for May 17th. Per pt she would like her medication to be sent to [facility] in [place]. Per pt that is the only [facility] in [place] because she does not have their number.

## 2016-12-17 NOTE — Telephone Encounter (Signed)
noted 

## 2016-12-17 NOTE — Telephone Encounter (Signed)
patient left voice message, said her mother had a medical emergency and she is in [place].   She said she need her medications

## 2016-12-17 NOTE — Telephone Encounter (Signed)
patient left voice message, said her mother had a medical emergency and she is in [place]. She said she need her medications

## 2016-12-18 ENCOUNTER — Other Ambulatory Visit (HOSPITAL_COMMUNITY): Payer: Self-pay | Admitting: Psychiatry

## 2016-12-18 DIAGNOSIS — F5105 Insomnia due to other mental disorder: Secondary | ICD-10-CM

## 2016-12-18 MED ORDER — DULOXETINE HCL 60 MG PO CPEP: 60.0000 mg | ORAL_CAPSULE | Freq: Two times a day (BID) | ORAL | 2 refills | Status: DC

## 2016-12-18 MED ORDER — TRAZODONE HCL 50 MG PO TABS: 100.0000 mg | ORAL_TABLET | Freq: Every day | ORAL | 2 refills | Status: DC

## 2016-12-18 MED ORDER — LAMOTRIGINE 25 MG PO TABS: ORAL_TABLET | ORAL | 2 refills | Status: DC

## 2016-12-18 NOTE — Telephone Encounter (Signed)
I have sent in all but the valium. Please call in 30 day supply

## 2016-12-20 ENCOUNTER — Ambulatory Visit (HOSPITAL_COMMUNITY): Payer: Self-pay | Admitting: Psychiatry

## 2016-12-21 ENCOUNTER — Telehealth (HOSPITAL_COMMUNITY): Payer: Self-pay | Admitting: *Deleted

## 2016-12-21 MED ORDER — DIAZEPAM 5 MG PO TABS: 5.0000 mg | ORAL_TABLET | Freq: Four times a day (QID) | ORAL | 0 refills | Status: DC

## 2016-12-21 NOTE — Telephone Encounter (Signed)
Per Dr. Harrington Challenger to call in refills for pt Valium 5 mg QID. Called pharmacy and spoke with [place]. Informed her with what Dr. Harrington Challenger stated and she stated she will get that ready for pt.

## 2017-01-03 ENCOUNTER — Ambulatory Visit (INDEPENDENT_AMBULATORY_CARE_PROVIDER_SITE_OTHER): Payer: 59 | Admitting: Psychiatry

## 2017-01-03 ENCOUNTER — Encounter (HOSPITAL_COMMUNITY): Payer: Self-pay | Admitting: Psychiatry

## 2017-01-03 VITALS — BP 121/67 | HR 64 | Ht 65.0 in | Wt 166.0 lb

## 2017-01-03 DIAGNOSIS — F5105 Insomnia due to other mental disorder: Secondary | ICD-10-CM | POA: Diagnosis not present

## 2017-01-03 DIAGNOSIS — Z79899 Other long term (current) drug therapy: Secondary | ICD-10-CM

## 2017-01-03 DIAGNOSIS — Z810 Family history of intellectual disabilities: Secondary | ICD-10-CM | POA: Diagnosis not present

## 2017-01-03 DIAGNOSIS — Z818 Family history of other mental and behavioral disorders: Secondary | ICD-10-CM | POA: Diagnosis not present

## 2017-01-03 DIAGNOSIS — Z811 Family history of alcohol abuse and dependence: Secondary | ICD-10-CM | POA: Diagnosis not present

## 2017-01-03 DIAGNOSIS — F411 Generalized anxiety disorder: Secondary | ICD-10-CM | POA: Diagnosis not present

## 2017-01-03 DIAGNOSIS — F410 Panic disorder [episodic paroxysmal anxiety] without agoraphobia: Secondary | ICD-10-CM | POA: Diagnosis not present

## 2017-01-03 DIAGNOSIS — Z813 Family history of other psychoactive substance abuse and dependence: Secondary | ICD-10-CM | POA: Diagnosis not present

## 2017-01-03 MED ORDER — TRAZODONE HCL 50 MG PO TABS: 100.0000 mg | ORAL_TABLET | Freq: Every day | ORAL | 2 refills | Status: DC

## 2017-01-03 MED ORDER — DIAZEPAM 5 MG PO TABS: 5.0000 mg | ORAL_TABLET | Freq: Four times a day (QID) | ORAL | 2 refills | Status: DC

## 2017-01-03 MED ORDER — LAMOTRIGINE 25 MG PO TABS: ORAL_TABLET | ORAL | 2 refills | Status: DC

## 2017-01-03 MED ORDER — BUPROPION HCL 100 MG PO TABS: 100.0000 mg | ORAL_TABLET | ORAL | 2 refills | Status: DC

## 2017-01-03 MED ORDER — DULOXETINE HCL 60 MG PO CPEP: 60.0000 mg | ORAL_CAPSULE | Freq: Two times a day (BID) | ORAL | 2 refills | Status: DC
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
## 2017-02-19 ENCOUNTER — Ambulatory Visit (INDEPENDENT_AMBULATORY_CARE_PROVIDER_SITE_OTHER): Payer: 59 | Admitting: Psychiatry

## 2017-02-19 ENCOUNTER — Encounter (HOSPITAL_COMMUNITY): Payer: Self-pay | Admitting: Psychiatry

## 2017-02-19 ENCOUNTER — Encounter (HOSPITAL_COMMUNITY): Payer: Self-pay

## 2017-02-19 VITALS — BP 130/82 | HR 60 | Ht 65.0 in | Wt 167.0 lb

## 2017-02-19 DIAGNOSIS — Z818 Family history of other mental and behavioral disorders: Secondary | ICD-10-CM | POA: Diagnosis not present

## 2017-02-19 DIAGNOSIS — F5105 Insomnia due to other mental disorder: Secondary | ICD-10-CM

## 2017-02-19 DIAGNOSIS — Z810 Family history of intellectual disabilities: Secondary | ICD-10-CM | POA: Diagnosis not present

## 2017-02-19 DIAGNOSIS — F411 Generalized anxiety disorder: Secondary | ICD-10-CM | POA: Diagnosis not present

## 2017-02-19 DIAGNOSIS — F410 Panic disorder [episodic paroxysmal anxiety] without agoraphobia: Secondary | ICD-10-CM | POA: Diagnosis not present

## 2017-02-19 DIAGNOSIS — Z813 Family history of other psychoactive substance abuse and dependence: Secondary | ICD-10-CM

## 2017-02-19 DIAGNOSIS — F3130 Bipolar disorder, current episode depressed, mild or moderate severity, unspecified: Secondary | ICD-10-CM

## 2017-02-19 MED ORDER — LAMOTRIGINE 25 MG PO TABS: ORAL_TABLET | ORAL | 2 refills | Status: DC

## 2017-02-19 MED ORDER — TRAZODONE HCL 50 MG PO TABS: 100.0000 mg | ORAL_TABLET | Freq: Every day | ORAL | 2 refills | Status: DC

## 2017-02-19 MED ORDER — DULOXETINE HCL 60 MG PO CPEP: 60.0000 mg | ORAL_CAPSULE | Freq: Two times a day (BID) | ORAL | 2 refills | Status: DC

## 2017-02-19 MED ORDER — BUPROPION HCL 100 MG PO TABS: 100.0000 mg | ORAL_TABLET | ORAL | 2 refills | Status: DC

## 2017-02-19 MED ORDER — DIAZEPAM 5 MG PO TABS: 5.0000 mg | ORAL_TABLET | Freq: Four times a day (QID) | ORAL | 2 refills | Status: DC
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
## 2017-04-23 ENCOUNTER — Telehealth (HOSPITAL_COMMUNITY): Payer: Self-pay | Admitting: *Deleted

## 2017-04-23 NOTE — Telephone Encounter (Signed)
Pt called stating she would like to leave a message for Dr. Harrington Challenger. Per pt she was wanting to know if she could taper off the Wellbutrin. Per pt she don't think she needs it any more. Per pt her family thinks she's been acting off/confused. Pt number is [phone]. Per pt chart, her f/u appt is October 3rd, 2018.

## 2017-04-23 NOTE — Telephone Encounter (Signed)
She can take half a tablet daily for a week, then stop

## 2017-04-23 NOTE — Telephone Encounter (Signed)
Per previous message, staff called pt to inform her what provider stated. Staff was unable to reach pt and lmtcb and office number was provided on pt voicemail.

## 2017-04-25 NOTE — Telephone Encounter (Signed)
Pt called office back due to previous message. Staff informed pt with what provider stated and pt verbalized understanding.

## 2017-05-21 ENCOUNTER — Telehealth (HOSPITAL_COMMUNITY): Payer: Self-pay | Admitting: *Deleted

## 2017-05-21 NOTE — Telephone Encounter (Signed)
Called pt and lmtcb to resch appt due to provider being out of office. Staff provided office number on pt mobile number

## 2017-05-22 ENCOUNTER — Ambulatory Visit (HOSPITAL_COMMUNITY): Payer: Self-pay | Admitting: Psychiatry

## 2017-05-30 ENCOUNTER — Encounter (HOSPITAL_COMMUNITY): Payer: Self-pay | Admitting: Psychiatry

## 2017-05-30 ENCOUNTER — Ambulatory Visit (INDEPENDENT_AMBULATORY_CARE_PROVIDER_SITE_OTHER): Payer: 59 | Admitting: Psychiatry

## 2017-05-30 VITALS — BP 106/67 | HR 72 | Wt 176.0 lb

## 2017-05-30 DIAGNOSIS — Z79899 Other long term (current) drug therapy: Secondary | ICD-10-CM

## 2017-05-30 DIAGNOSIS — F17210 Nicotine dependence, cigarettes, uncomplicated: Secondary | ICD-10-CM

## 2017-05-30 DIAGNOSIS — F5105 Insomnia due to other mental disorder: Secondary | ICD-10-CM

## 2017-05-30 DIAGNOSIS — Z91410 Personal history of adult physical and sexual abuse: Secondary | ICD-10-CM

## 2017-05-30 DIAGNOSIS — F410 Panic disorder [episodic paroxysmal anxiety] without agoraphobia: Secondary | ICD-10-CM | POA: Diagnosis not present

## 2017-05-30 DIAGNOSIS — Z818 Family history of other mental and behavioral disorders: Secondary | ICD-10-CM

## 2017-05-30 DIAGNOSIS — F3130 Bipolar disorder, current episode depressed, mild or moderate severity, unspecified: Secondary | ICD-10-CM

## 2017-05-30 DIAGNOSIS — F411 Generalized anxiety disorder: Secondary | ICD-10-CM | POA: Diagnosis not present

## 2017-05-30 MED ORDER — DULOXETINE HCL 60 MG PO CPEP: 60.0000 mg | ORAL_CAPSULE | Freq: Two times a day (BID) | ORAL | 2 refills | Status: DC

## 2017-05-30 MED ORDER — DIAZEPAM 5 MG PO TABS: 5.0000 mg | ORAL_TABLET | Freq: Four times a day (QID) | ORAL | 2 refills | Status: DC

## 2017-05-30 MED ORDER — LAMOTRIGINE 25 MG PO TABS: ORAL_TABLET | ORAL | 2 refills | Status: DC

## 2017-05-30 MED ORDER — TRAZODONE HCL 50 MG PO TABS: 100.0000 mg | ORAL_TABLET | Freq: Every day | ORAL | 2 refills | Status: DC
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
## 2017-06-28 ENCOUNTER — Telehealth (HOSPITAL_COMMUNITY): Payer: Self-pay | Admitting: *Deleted

## 2017-06-28 NOTE — Telephone Encounter (Signed)
This is a patient of Dr. Harrington Challenger

## 2017-06-28 NOTE — Telephone Encounter (Signed)
They can have the override, she had never had a problem with interaction

## 2017-06-28 NOTE — Telephone Encounter (Signed)
Called pt pharmacy and spoke with Caryl Pina and informed her with what provider stated and she verbalized understanding.

## 2017-06-28 NOTE — Telephone Encounter (Signed)
Shelly from [facility] called needing an override for them to fill pt Valium Per Darrick Penna, there is a drug to drug interaction with Valium and Tramadol. Pt pharmacy number is [phone].

## 2017-07-26 ENCOUNTER — Telehealth (HOSPITAL_COMMUNITY): Payer: Self-pay | Admitting: *Deleted

## 2017-07-26 NOTE — Telephone Encounter (Signed)
Dr Philip Aspen from  [facility]  States that  She needs a Confirmation to override the refill for  Diazepam since patient is already on Tramadol.

## 2017-07-30 NOTE — Telephone Encounter (Signed)
[facility] NOTIFIED PER DR ROSS: IT IS FINE FOR Confirmation to override the refill for  Diazepam since patient is already on Tramadol

## 2017-07-30 NOTE — Telephone Encounter (Signed)
Yes that is fine

## 2017-08-27 ENCOUNTER — Other Ambulatory Visit (HOSPITAL_COMMUNITY): Payer: Self-pay | Admitting: Psychiatry

## 2017-08-29 ENCOUNTER — Ambulatory Visit (HOSPITAL_COMMUNITY): Payer: Medicare Other | Admitting: Psychiatry

## 2017-08-29 ENCOUNTER — Encounter (HOSPITAL_COMMUNITY): Payer: Self-pay | Admitting: Psychiatry

## 2017-08-29 VITALS — BP 118/76 | HR 85 | Ht 65.0 in | Wt 180.0 lb

## 2017-08-29 DIAGNOSIS — R450 Nervousness: Secondary | ICD-10-CM | POA: Diagnosis not present

## 2017-08-29 DIAGNOSIS — Z818 Family history of other mental and behavioral disorders: Secondary | ICD-10-CM

## 2017-08-29 DIAGNOSIS — F17210 Nicotine dependence, cigarettes, uncomplicated: Secondary | ICD-10-CM | POA: Diagnosis not present

## 2017-08-29 DIAGNOSIS — Z736 Limitation of activities due to disability: Secondary | ICD-10-CM

## 2017-08-29 DIAGNOSIS — F410 Panic disorder [episodic paroxysmal anxiety] without agoraphobia: Secondary | ICD-10-CM

## 2017-08-29 DIAGNOSIS — F5105 Insomnia due to other mental disorder: Secondary | ICD-10-CM | POA: Diagnosis not present

## 2017-08-29 DIAGNOSIS — F419 Anxiety disorder, unspecified: Secondary | ICD-10-CM

## 2017-08-29 MED ORDER — DIAZEPAM 5 MG PO TABS: 5.0000 mg | ORAL_TABLET | Freq: Four times a day (QID) | ORAL | 3 refills | Status: DC

## 2017-08-29 MED ORDER — DULOXETINE HCL 60 MG PO CPEP: 60.0000 mg | ORAL_CAPSULE | Freq: Two times a day (BID) | ORAL | 2 refills | Status: DC

## 2017-08-29 MED ORDER — TRAZODONE HCL 50 MG PO TABS: 100.0000 mg | ORAL_TABLET | Freq: Every day | ORAL | 2 refills | Status: DC

## 2017-08-29 MED ORDER — LAMOTRIGINE 25 MG PO TABS: ORAL_TABLET | ORAL | 2 refills | Status: DC

## 2017-08-29 NOTE — Progress Notes (Signed)
BH MD/PA/NP OP Progress Note  08/29/2017 1:31 PM Ashley Powell  MRN:  [id number]  Chief Complaint:  Chief Complaint    Depression; Anxiety; Follow-up     HPI:  This patient is a 57 year old married white female who lives with her husband in [place]. [place]. She is on disability.  The patient states that she was a very successful person most of her life. 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 She was very controlling about her work. In 2008 she had some sort of nervous breakdown which seemed to coincide with a stroke. 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 . She still has right-sided weakness and her speech begins to slur when she's tired.  In 2008 she was admitted to a psychiatric hospital. She was out of touch with reality and remained psychotic on and off for several months. After this improved she became extremely phobic. It has taken her a long time to feel comfortable leaving her house.   She returns after 4 months.  Last October she was walking at night and banged into a flower box.  This turned into a large traumatic hematoma on her right leg.  She has had be in a recliner for the most part for the last several months and keep her leg elevated.  She is going to wound care on a regular basis.  It is slowly healing probably because she is on blood thinners and is also diabetic.  She has been very frustrated and at times depressed because of her restrictions due to this wound.  However she is almost to the point where she can get a compression stocking and no longer have the leg wraps.  She states that her mood is under good control and she is optimistic that things will get better in the next couple of months and she will be able to start walking normally.  She is sleeping fairly well and she denies any panic attacks or  confusion   Visit Diagnosis:    ICD-10-CM   1. Panic attacks F41.0   2. Insomnia due to mental disorder F51.05 traZODone (DESYREL) 50 MG tablet    Past Psychiatric History: Previous hospitalization for psychosis in 2008.  Past Medical History:  Past Medical History:  Diagnosis Date  . Anxiety   . Bipolar 1 disorder ([place])   . Carotid artery occlusion   . Complication of anesthesia    difficulty waking up  . COPD (chronic obstructive pulmonary disease) ([place]) 2010 ?  Marland Kitchen Depression   . Diabetes mellitus, type II ([place])   . DU (duodenal ulcer)    age 46  . Family history of anesthesia complication    mother N/V unless premedicated  . GERD (gastroesophageal reflux disease)   . Hypercholesteremia   . Malignant melanoma ([place]) 1998   Stage III-left shoulder  . Obsessive-compulsive disorder   . Panic attacks   . Peripheral vascular disease ([place])   . Stroke [facility])     Past Surgical History:  Procedure Laterality Date  . CAROTID ENDARTERECTOMY  04/02/12   Left cea  . CATARACT EXTRACTION, BILATERAL  2012  . CHOLECYSTECTOMY  2001  . COLONOSCOPY  01/01/12   multiple polyps, internal hemorrhoids, hyperplastic, repeat in 2023  . ENDARTERECTOMY  04/02/2012   Procedure: ENDARTERECTOMY CAROTID;  Surgeon:  Rosetta Posner, MD;  [facility];  Service: Vascular;  Laterality: Left;  left carotid endarterectomy with patch angioplasty  . ESOPHAGOGASTRODUODENOSCOPY  01/01/12   patent stricture in the distal esophagus.small hiatal hernia/abd pain due to IBS, GERD, or gastritis  . LYMPH NODE DISSECTION  1998  . [place]    . TRANSTHORACIC ECHOCARDIOGRAM  02/2012  . TUBAL LIGATION  1984    Family Psychiatric History: see below  Family History:  Family History  Problem Relation Age of Onset  . Depression Mother   . Hyperlipidemia Mother   . Anxiety disorder Mother   . Hypertension Father   . Hyperlipidemia Father   . Heart disease Father         before age 60  . Diabetes Father   . Heart attack Father   . OCD Father   . Hypertension Brother   . Heart disease Brother   . Hyperlipidemia Brother   . Depression Brother   . Heart attack Brother   . Bipolar disorder Brother   . Drug abuse Brother   . Schizophrenia Neg Hx   . Physical abuse Neg Hx   . Sexual abuse Neg Hx   . ADD / ADHD Neg Hx   . Alcohol abuse Neg Hx   . Paranoid behavior Neg Hx   . Seizures Neg Hx   . Dementia Maternal Grandfather     Social History:  Social History   Socioeconomic History  . Marital status: Married    Spouse name: None  . Number of children: 1  . Years of education: None  . Highest education level: None  Social Needs  . Financial resource strain: None  . Food insecurity - worry: None  . Food insecurity - inability: None  . Transportation needs - medical: None  . Transportation needs - non-medical: None  Occupational History  . Occupation: disabled-police dept  Tobacco Use  . Smoking status: Current Every Day Smoker    Packs/day: 1.00    Years: 22.00    Pack years: 22.00    Types: Cigarettes  . Smokeless tobacco: Never Used  . Tobacco comment: about a pack a day as of 02/04/2013  Substance and Sexual Activity  . Alcohol use: No  . Drug use: No  . Sexual activity: Not Currently    Birth control/protection: Post-menopausal  Other Topics Concern  . None  Social History Narrative  . None    Allergies:  Allergies  Allergen Reactions  . Geodon [Ziprasidone Hcl] Other (See Comments)    Body convulsions.   . Abilify [Aripiprazole] Other (See Comments)    Headaches and stayed up for 3 days.  . Latex Other (See Comments)    Reaction: cause skin to peel off   . Statins     Metabolic Disorder Labs: Lab Results  Component Value Date   HGBA1C 6.8 (H) 07/10/2013   No results found for: PROLACTIN Lab Results  Component Value Date   CHOL 161 07/10/2013   TRIG 184 (H) 07/10/2013   HDL 41 07/10/2013   CHOLHDL 3.9  07/10/2013   VLDL 37 07/10/2013   LDLCALC 83 07/10/2013   LDLCALC 216 (H) 07/22/2012   Lab Results  Component Value Date   TSH 1.258 04/22/2013   TSH 2.783 12/10/2011    Therapeutic Level Labs: No results found for: LITHIUM Lab Results  Component Value Date   VALPROATE 112.2 (H) 12/10/2011   No components found for:  CBMZ  Current Medications: Current Outpatient Medications  Medication Sig Dispense Refill  . albuterol (PROAIR HFA) 108 (90 BASE) MCG/ACT inhaler Inhale 2 puffs into the lungs every 4 (four) hours as needed for wheezing or shortness of breath. 1 Inhaler 6  . aspirin EC 81 MG tablet Take 81 mg by mouth daily.    Marland Kitchen atorvastatin (LIPITOR) 40 MG tablet     . clopidogrel (PLAVIX) 75 MG tablet Take 75 mg by mouth daily.    . diazepam (VALIUM) 5 MG tablet Take 1 tablet (5 mg total) by mouth 4 (four) times daily. 120 tablet 3  . DULoxetine (CYMBALTA) 60 MG capsule Take 1 capsule (60 mg total) by mouth 2 (two) times daily. 180 capsule 2  . fluticasone (FLONASE) 50 MCG/ACT nasal spray 2 sprays as needed.     . lamoTRIgine (LAMICTAL) 25 MG tablet Take three tablets qam 270 tablet 2  . losartan (COZAAR) 25 MG tablet Take 25 mg by mouth daily.    . metFORMIN (GLUCOPHAGE) 500 MG tablet Take by mouth. Taking 2 Tablet QAM and 2 Tablets QHS    . metoprolol tartrate (LOPRESSOR) 25 MG tablet Take 25 mg by mouth 2 (two) times daily.    . montelukast (SINGULAIR) 10 MG tablet Take 10 mg by mouth at bedtime.    . nitroGLYCERIN (NITROSTAT) 0.4 MG SL tablet Place 0.4 mg under the tongue every 5 (five) minutes as needed for chest pain.    . traZODone (DESYREL) 50 MG tablet Take 2 tablets (100 mg total) by mouth at bedtime. 90 tablet 2  . vitamin B-12 (CYANOCOBALAMIN) 500 MCG tablet Take 500 mcg by mouth daily.    . pantoprazole (PROTONIX) 40 MG tablet Take 1 tablet (40 mg total) by mouth 2 (two) times daily. 60 tablet 0   No current facility-administered medications for this visit.       Musculoskeletal: Strength & Muscle Tone: decreased Gait & Station: unsteady Patient leans: N/A  Psychiatric Specialty Exam: Review of Systems  Musculoskeletal: Positive for myalgias.  Endo/Heme/Allergies: Bruises/bleeds easily.  Psychiatric/Behavioral: The patient is nervous/anxious.   All other systems reviewed and are negative.   Blood pressure 118/76, pulse 85, height 5\' 5"  (1.651 m), weight 180 lb (81.6 kg), last menstrual period 05/18/2013, SpO2 93 %.Body mass index is 29.95 kg/m.  General Appearance: Casual, Neat and Well Groomed  Eye Contact:  Good  Speech:  Clear and Coherent  Volume:  Decreased  Mood:  Anxious  Affect:  Congruent  Thought Process:  Goal Directed  Orientation:  Full (Time, Place, and Person)  Thought Content: Rumination   Suicidal Thoughts:  No  Homicidal Thoughts:  No  Memory:  Immediate;   Good Recent;   Fair Remote;   Fair  Judgement:  Fair  Insight:  Fair  Psychomotor Activity:  Decreased  Concentration:  Concentration: Good and Attention Span: Good  Recall:  Good  Fund of Knowledge: Good  Language: Good  Akathisia:  No  Handed:  Right  AIMS (if indicated): not done  Assets:  Communication Skills Desire for Improvement Resilience Social Support Talents/Skills  ADL's:  Intact  Cognition: WNL  Sleep:  Good   Screenings:   Assessment and Plan: She is a 57 year old female with a history of depression anxiety and 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  Despite her recent traumatic hematoma she seems to be doing well emotionally.  She will continue trazodone 100 mg at bedtime for sleep, Lamictal 5 mg daily for mood stabilization,  Cymbalta 60 mg twice a day for depression and Valium 5 mg 4 times a day for anxiety.  She will return to see me in 4 months   Levonne Spiller, MD 08/29/2017, 1:31 PM

## 2017-12-25 ENCOUNTER — Other Ambulatory Visit (HOSPITAL_COMMUNITY): Payer: Self-pay | Admitting: Psychiatry

## 2017-12-25 DIAGNOSIS — F5105 Insomnia due to other mental disorder: Secondary | ICD-10-CM

## 2017-12-31 ENCOUNTER — Encounter (HOSPITAL_COMMUNITY): Payer: Self-pay | Admitting: Psychiatry

## 2017-12-31 ENCOUNTER — Ambulatory Visit (HOSPITAL_COMMUNITY): Payer: Medicare Other | Admitting: Psychiatry

## 2017-12-31 DIAGNOSIS — F329 Major depressive disorder, single episode, unspecified: Secondary | ICD-10-CM | POA: Diagnosis not present

## 2017-12-31 DIAGNOSIS — F17210 Nicotine dependence, cigarettes, uncomplicated: Secondary | ICD-10-CM

## 2017-12-31 DIAGNOSIS — M2550 Pain in unspecified joint: Secondary | ICD-10-CM | POA: Diagnosis not present

## 2017-12-31 DIAGNOSIS — Z813 Family history of other psychoactive substance abuse and dependence: Secondary | ICD-10-CM

## 2017-12-31 DIAGNOSIS — Z810 Family history of intellectual disabilities: Secondary | ICD-10-CM | POA: Diagnosis not present

## 2017-12-31 DIAGNOSIS — F5105 Insomnia due to other mental disorder: Secondary | ICD-10-CM

## 2017-12-31 DIAGNOSIS — Z818 Family history of other mental and behavioral disorders: Secondary | ICD-10-CM

## 2017-12-31 DIAGNOSIS — I69328 Other speech and language deficits following cerebral infarction: Secondary | ICD-10-CM | POA: Diagnosis not present

## 2017-12-31 DIAGNOSIS — F419 Anxiety disorder, unspecified: Secondary | ICD-10-CM

## 2017-12-31 DIAGNOSIS — I69351 Hemiplegia and hemiparesis following cerebral infarction affecting right dominant side: Secondary | ICD-10-CM | POA: Diagnosis not present

## 2017-12-31 DIAGNOSIS — Z9181 History of falling: Secondary | ICD-10-CM | POA: Diagnosis not present

## 2017-12-31 MED ORDER — TRAZODONE HCL 50 MG PO TABS: 100.0000 mg | ORAL_TABLET | Freq: Every day | ORAL | 2 refills | Status: AC

## 2017-12-31 MED ORDER — LAMOTRIGINE 25 MG PO TABS: ORAL_TABLET | ORAL | 2 refills | Status: AC

## 2017-12-31 MED ORDER — DIAZEPAM 5 MG PO TABS: 5.0000 mg | ORAL_TABLET | Freq: Four times a day (QID) | ORAL | 3 refills | Status: AC

## 2017-12-31 MED ORDER — DULOXETINE HCL 60 MG PO CPEP: 60.0000 mg | ORAL_CAPSULE | Freq: Two times a day (BID) | ORAL | 2 refills | Status: AC

## 2017-12-31 NOTE — Progress Notes (Signed)
BH MD/PA/NP OP Progress Note  12/31/2017 1:22 PM Ashley Powell  MRN:  [id number]  Chief Complaint:  Chief Complaint    Depression; Anxiety; Follow-up     HPI: This patient is a 57 year old married white female who lives with her husband in [place]. [place]. She is on disability.  The patient states that she was a very successful person most of her life. She worked in [employer] in [place] and had a large amount of responsibility. She was very controlling about her work. In 2008 she had some sort of nervous breakdown which seemed to coincide with a stroke. 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 . She still has right-sided weakness and her speech begins to slur when she's tired.  In 2008 she was admitted to a psychiatric hospital. She was out of touch with reality and remained psychotic on and off for several months. After this improved she became extremely phobic. It has taken her a long time to feel comfortable leaving her house  The patient returns after 4 months.  Last time she was mostly confined to her recliner because she banged her leg and developed acute hematoma.  It is finally healed for the most part she is back walking some again but it still hurts.  Last month she had to have a cardiac endarterectomy again.  She is also been experiencing chest pain and is now on Imdur.  She has had several catheterizations in the past.  She is getting tired of all the medical issues.  She is to try to quit smoking and has gotten some NicoDerm patches and is set a goal date of July 4.  Her mood was very low when she was stuck in the recliner but now she is feeling better and more hopeful and her energy is starting to come back she denies suicidal ideation Visit Diagnosis:    ICD-10-CM   1. Insomnia due to mental disorder F51.05 traZODone (DESYREL) 50 MG tablet    Past  Psychiatric History: Previous hospitalization for psychosis in 2008  Past Medical History:  Past Medical History:  Diagnosis Date  . Anxiety   . Bipolar 1 disorder ([place])   . Carotid artery occlusion   . Complication of anesthesia    difficulty waking up  . COPD (chronic obstructive pulmonary disease) ([place]) 2010 ?  Marland Kitchen Depression   . Diabetes mellitus, type II ([place])   . DU (duodenal ulcer)    age 22  . Family history of anesthesia complication    mother N/V unless premedicated  . GERD (gastroesophageal reflux disease)   . Hypercholesteremia   . Malignant melanoma ([place]) 1998   Stage III-left shoulder  . Obsessive-compulsive disorder   . Panic attacks   . Peripheral vascular disease ([place])   . Stroke [facility])     Past Surgical History:  Procedure Laterality Date  . CAROTID ENDARTERECTOMY  04/02/12   Left cea  . CATARACT EXTRACTION, BILATERAL  2012  . CHOLECYSTECTOMY  2001  . COLONOSCOPY  01/01/12   multiple polyps, internal hemorrhoids, hyperplastic, repeat in 2023  . ENDARTERECTOMY  04/02/2012   Procedure: ENDARTERECTOMY CAROTID;  Surgeon: Rosetta Posner, MD;  Location: [facility] OR;  Service: Vascular;  Laterality: Left;  left carotid endarterectomy with patch angioplasty  . ESOPHAGOGASTRODUODENOSCOPY  01/01/12   patent stricture in the distal esophagus.small  hiatal hernia/abd pain due to IBS, GERD, or gastritis  . LYMPH NODE DISSECTION  1998  . [place]    . TRANSTHORACIC ECHOCARDIOGRAM  02/2012  . TUBAL LIGATION  1984    Family Psychiatric History: See below  Family History:  Family History  Problem Relation Age of Onset  . Depression Mother   . Hyperlipidemia Mother   . Anxiety disorder Mother   . Hypertension Father   . Hyperlipidemia Father   . Heart disease Father        before age 61  . Diabetes Father   . Heart attack Father   . OCD Father   . Hypertension Brother   . Heart disease Brother   . Hyperlipidemia Brother   .  Depression Brother   . Heart attack Brother   . Bipolar disorder Brother   . Drug abuse Brother   . Schizophrenia Neg Hx   . Physical abuse Neg Hx   . Sexual abuse Neg Hx   . ADD / ADHD Neg Hx   . Alcohol abuse Neg Hx   . Paranoid behavior Neg Hx   . Seizures Neg Hx   . Dementia Maternal Grandfather     Social History:  Social History   Socioeconomic History  . Marital status: Married    Spouse name: Not on file  . Number of children: 1  . Years of education: Not on file  . Highest education level: Not on file  Occupational History  . Occupation: disabled-police dept  Social Needs  . Financial resource strain: Not on file  . Food insecurity:    Worry: Not on file    Inability: Not on file  . Transportation needs:    Medical: Not on file    Non-medical: Not on file  Tobacco Use  . Smoking status: Current Every Day Smoker    Packs/day: 1.00    Years: 22.00    Pack years: 22.00    Types: Cigarettes  . Smokeless tobacco: Never Used  . Tobacco comment: about a pack a day as of 02/04/2013  Substance and Sexual Activity  . Alcohol use: No  . Drug use: No  . Sexual activity: Not Currently    Birth control/protection: Post-menopausal  Lifestyle  . Physical activity:    Days per week: Not on file    Minutes per session: Not on file  . Stress: Not on file  Relationships  . Social connections:    Talks on phone: Not on file    Gets together: Not on file    Attends religious service: Not on file    Active member of club or organization: Not on file    Attends meetings of clubs or organizations: Not on file    Relationship status: Not on file  Other Topics Concern  . Not on file  Social History Narrative  . Not on file    Allergies:  Allergies  Allergen Reactions  . Geodon [Ziprasidone Hcl] Other (See Comments)    Body convulsions.   . Abilify [Aripiprazole] Other (See Comments)    Headaches and stayed up for 3 days.  . Latex Other (See Comments)    Reaction:  cause skin to peel off   . Statins     Metabolic Disorder Labs: Lab Results  Component Value Date   HGBA1C 6.8 (H) 07/10/2013   No results found for: PROLACTIN Lab Results  Component Value Date   CHOL 161  07/10/2013   TRIG 184 (H) 07/10/2013   HDL 41 07/10/2013   CHOLHDL 3.9 07/10/2013   VLDL 37 07/10/2013   LDLCALC 83 07/10/2013   LDLCALC 216 (H) 07/22/2012   Lab Results  Component Value Date   TSH 1.258 04/22/2013   TSH 2.783 12/10/2011    Therapeutic Level Labs: No results found for: LITHIUM Lab Results  Component Value Date   VALPROATE 112.2 (H) 12/10/2011   No components found for:  CBMZ  Current Medications: Current Outpatient Medications  Medication Sig Dispense Refill  . albuterol (PROAIR HFA) 108 (90 BASE) MCG/ACT inhaler Inhale 2 puffs into the lungs every 4 (four) hours as needed for wheezing or shortness of breath. 1 Inhaler 6  . aspirin EC 81 MG tablet Take 81 mg by mouth daily.    Marland Kitchen atorvastatin (LIPITOR) 40 MG tablet     . clopidogrel (PLAVIX) 75 MG tablet Take 75 mg by mouth daily.    . diazepam (VALIUM) 5 MG tablet Take 1 tablet (5 mg total) by mouth 4 (four) times daily. 120 tablet 3  . DULoxetine (CYMBALTA) 60 MG capsule Take 1 capsule (60 mg total) by mouth 2 (two) times daily. 180 capsule 2  . fluticasone (FLONASE) 50 MCG/ACT nasal spray 2 sprays as needed.     . isosorbide mononitrate (IMDUR) 30 MG 24 hr tablet     . lamoTRIgine (LAMICTAL) 25 MG tablet Take three tablets qam 270 tablet 2  . losartan (COZAAR) 25 MG tablet Take 25 mg by mouth daily.    . metFORMIN (GLUCOPHAGE) 500 MG tablet Take by mouth. Taking 2 Tablet QAM and 2 Tablets QHS    . metoprolol tartrate (LOPRESSOR) 25 MG tablet Take 25 mg by mouth 2 (two) times daily.    . montelukast (SINGULAIR) 10 MG tablet Take 10 mg by mouth at bedtime.    . nitroGLYCERIN (NITROSTAT) 0.4 MG SL tablet Place 0.4 mg under the tongue every 5 (five) minutes as needed for chest pain.    . traZODone  (DESYREL) 50 MG tablet Take 2 tablets (100 mg total) by mouth at bedtime. 180 tablet 2  . vitamin B-12 (CYANOCOBALAMIN) 500 MCG tablet Take 500 mcg by mouth daily.    . pantoprazole (PROTONIX) 40 MG tablet Take 1 tablet (40 mg total) by mouth 2 (two) times daily. 60 tablet 0   No current facility-administered medications for this visit.      Musculoskeletal: Strength & Muscle Tone: decreased Gait & Station: normal Patient leans: N/A  Psychiatric Specialty Exam: Review of Systems  Constitutional: Positive for malaise/fatigue.  Musculoskeletal: Positive for falls and joint pain.  Neurological: Positive for weakness.  All other systems reviewed and are negative.   Blood pressure 112/73, pulse 74, height 5\' 5"  (1.651 m), weight 172 lb (78 kg), last menstrual period 05/18/2013, SpO2 96 %.Body mass index is 28.62 kg/m.  General Appearance: Casual, Neat and Well Groomed  Eye Contact:  Good  Speech:  Clear and Coherent  Volume:  Normal  Mood:  Euthymic  Affect:  Congruent  Thought Process:  Goal Directed  Orientation:  Full (Time, Place, and Person)  Thought Content: Rumination   Suicidal Thoughts:  No  Homicidal Thoughts:  No  Memory:  Immediate;   Good Recent;   Fair Remote;   Fair  Judgement:  Fair  Insight:  Fair  Psychomotor Activity:  Decreased  Concentration:  Concentration: Fair and Attention Span: Fair  Recall:  Good  Fund of Knowledge: Good  Language: Good  Akathisia:  No  Handed:  Right  AIMS (if indicated): not done  Assets:  Communication Skills Desire for Improvement Resilience Social Support Talents/Skills  ADL's:  Intact  Cognition: WNL  Sleep:  Good   Screenings:   Assessment and Plan: This patient is a 57 year old female who has had significant cardiovascular disease stroke depression and even psychosis in the past.  She is much more stable now.  She will continue Cymbalta 60 mg twice a day for depression, Valium 5 mg 4 times a day for anxiety,  Lamictal 75 mg daily for mood stabilization and trazodone 100 mg at bedtime for sleep.  She will return to see me in 4 months   Levonne Spiller, MD 12/31/2017, 1:22 PM

## 2018-05-05 ENCOUNTER — Ambulatory Visit (HOSPITAL_COMMUNITY): Payer: Self-pay | Admitting: Psychiatry

## 2021-12-05 ENCOUNTER — Encounter: Payer: Self-pay | Admitting: *Deleted
